# Patient Record
Sex: Male | Born: 1945 | Race: Black or African American | Hispanic: No | Marital: Married | Smoking: Former smoker
Health system: Southern US, Community
[De-identification: ages and names within clinical notes are randomized; demographics above are authoritative.]

## PROBLEM LIST (undated history)

## (undated) DIAGNOSIS — I639 Cerebral infarction, unspecified: Secondary | ICD-10-CM

## (undated) DIAGNOSIS — R4701 Aphasia: Secondary | ICD-10-CM

## (undated) DIAGNOSIS — R569 Unspecified convulsions: Secondary | ICD-10-CM

## (undated) DIAGNOSIS — C3490 Malignant neoplasm of unspecified part of unspecified bronchus or lung: Secondary | ICD-10-CM

## (undated) HISTORY — DX: Aphasia: R47.01

## (undated) HISTORY — DX: Cerebral infarction, unspecified: I63.9

## (undated) HISTORY — PX: PARTIAL COLECTOMY: SHX5273

## (undated) HISTORY — DX: Malignant neoplasm of unspecified part of unspecified bronchus or lung: C34.90

## (undated) HISTORY — DX: Unspecified convulsions: R56.9

---

## 2007-11-25 ENCOUNTER — Emergency Department: Payer: Self-pay | Admitting: Unknown Physician Specialty

## 2007-11-27 ENCOUNTER — Other Ambulatory Visit: Payer: Self-pay

## 2007-11-27 ENCOUNTER — Inpatient Hospital Stay: Payer: Self-pay | Admitting: Internal Medicine

## 2009-02-02 ENCOUNTER — Emergency Department: Payer: Self-pay | Admitting: Emergency Medicine

## 2014-05-04 DIAGNOSIS — Z1211 Encounter for screening for malignant neoplasm of colon: Secondary | ICD-10-CM | POA: Insufficient documentation

## 2014-05-29 ENCOUNTER — Ambulatory Visit: Payer: Self-pay | Admitting: Gastroenterology

## 2014-06-01 LAB — PATHOLOGY REPORT

## 2014-07-26 ENCOUNTER — Ambulatory Visit: Payer: Self-pay | Admitting: Surgery

## 2014-08-08 ENCOUNTER — Ambulatory Visit: Payer: Self-pay | Admitting: Surgery

## 2014-09-26 ENCOUNTER — Ambulatory Visit: Payer: Self-pay | Admitting: Internal Medicine

## 2014-09-26 DIAGNOSIS — E785 Hyperlipidemia, unspecified: Secondary | ICD-10-CM

## 2014-09-26 DIAGNOSIS — I10 Essential (primary) hypertension: Secondary | ICD-10-CM

## 2014-09-26 LAB — CBC WITH DIFFERENTIAL/PLATELET
BASOS PCT: 1.1 %
Basophil #: 0.1 10*3/uL (ref 0.0–0.1)
EOS PCT: 2.9 %
Eosinophil #: 0.2 10*3/uL (ref 0.0–0.7)
HCT: 43.1 % (ref 40.0–52.0)
HGB: 13.9 g/dL (ref 13.0–18.0)
LYMPHS PCT: 39.1 %
Lymphocyte #: 2.8 10*3/uL (ref 1.0–3.6)
MCH: 31.2 pg (ref 26.0–34.0)
MCHC: 32.3 g/dL (ref 32.0–36.0)
MCV: 97 fL (ref 80–100)
MONO ABS: 1.2 x10 3/mm — AB (ref 0.2–1.0)
Monocyte %: 16.4 %
NEUTROS PCT: 40.5 %
Neutrophil #: 2.9 10*3/uL (ref 1.4–6.5)
Platelet: 184 10*3/uL (ref 150–440)
RBC: 4.46 10*6/uL (ref 4.40–5.90)
RDW: 15.3 % — ABNORMAL HIGH (ref 11.5–14.5)
WBC: 7.1 10*3/uL (ref 3.8–10.6)

## 2014-09-27 ENCOUNTER — Ambulatory Visit: Payer: Self-pay | Admitting: Internal Medicine

## 2014-10-10 ENCOUNTER — Ambulatory Visit: Payer: Self-pay | Admitting: Oncology

## 2014-10-10 LAB — CBC WITH DIFFERENTIAL/PLATELET
BASOS ABS: 0 10*3/uL (ref 0.0–0.1)
Basophil %: 0.4 %
EOS ABS: 0.2 10*3/uL (ref 0.0–0.7)
Eosinophil %: 3 %
HCT: 39.9 % — AB (ref 40.0–52.0)
HGB: 13.3 g/dL (ref 13.0–18.0)
LYMPHS ABS: 3 10*3/uL (ref 1.0–3.6)
Lymphocyte %: 48 %
MCH: 31.1 pg (ref 26.0–34.0)
MCHC: 33.3 g/dL (ref 32.0–36.0)
MCV: 93 fL (ref 80–100)
MONO ABS: 1 x10 3/mm (ref 0.2–1.0)
Monocyte %: 16 %
NEUTROS ABS: 2 10*3/uL (ref 1.4–6.5)
Neutrophil %: 32.6 %
PLATELETS: 217 10*3/uL (ref 150–440)
RBC: 4.28 10*6/uL — ABNORMAL LOW (ref 4.40–5.90)
RDW: 15 % — AB (ref 11.5–14.5)
WBC: 6.2 10*3/uL (ref 3.8–10.6)

## 2014-10-10 LAB — COMPREHENSIVE METABOLIC PANEL
ALK PHOS: 142 U/L — AB (ref 46–116)
ALT: 42 U/L (ref 14–63)
AST: 34 U/L (ref 15–37)
Albumin: 3.5 g/dL (ref 3.4–5.0)
Anion Gap: 6 — ABNORMAL LOW (ref 7–16)
BUN: 11 mg/dL (ref 7–18)
Bilirubin,Total: 0.2 mg/dL (ref 0.2–1.0)
CREATININE: 0.97 mg/dL (ref 0.60–1.30)
Calcium, Total: 8.4 mg/dL — ABNORMAL LOW (ref 8.5–10.1)
Chloride: 104 mmol/L (ref 98–107)
Co2: 30 mmol/L (ref 21–32)
EGFR (African American): >60
EGFR (Non-African Amer.): >60
GLUCOSE: 100 mg/dL — AB (ref 65–99)
Osmolality: 279 (ref 275–301)
Potassium: 3.7 mmol/L (ref 3.5–5.1)
Sodium: 140 mmol/L (ref 136–145)
Total Protein: 8.4 g/dL — ABNORMAL HIGH (ref 6.4–8.2)

## 2014-10-19 ENCOUNTER — Ambulatory Visit: Payer: Self-pay | Admitting: Oncology

## 2014-11-06 ENCOUNTER — Ambulatory Visit
Admit: 2014-11-06 | Disposition: A | Discharge status: Home / Self Care | Payer: Self-pay | Attending: Oncology | Admitting: Oncology

## 2014-11-15 ENCOUNTER — Ambulatory Visit: Payer: Self-pay | Admitting: Internal Medicine

## 2014-11-30 ENCOUNTER — Ambulatory Visit: Payer: Self-pay | Admitting: Vascular Surgery

## 2014-12-06 LAB — CBC CANCER CENTER
Basophil #: 0 x10 3/mm (ref 0.0–0.1)
Basophil %: 0.5 %
EOS PCT: 2.2 %
Eosinophil #: 0.1 x10 3/mm (ref 0.0–0.7)
HCT: 39.8 % — ABNORMAL LOW (ref 40.0–52.0)
HGB: 13.4 g/dL (ref 13.0–18.0)
Lymphocyte #: 2.7 x10 3/mm (ref 1.0–3.6)
Lymphocyte %: 43.4 %
MCH: 31.5 pg (ref 26.0–34.0)
MCHC: 33.6 g/dL (ref 32.0–36.0)
MCV: 94 fL (ref 80–100)
MONO ABS: 1 x10 3/mm (ref 0.2–1.0)
Monocyte %: 16.1 %
Neutrophil #: 2.4 x10 3/mm (ref 1.4–6.5)
Neutrophil %: 37.8 %
Platelet: 183 x10 3/mm (ref 150–440)
RBC: 4.25 10*6/uL — ABNORMAL LOW (ref 4.40–5.90)
RDW: 14.7 % — ABNORMAL HIGH (ref 11.5–14.5)
WBC: 6.2 x10 3/mm (ref 3.8–10.6)

## 2014-12-06 LAB — COMPREHENSIVE METABOLIC PANEL
ANION GAP: 4 — AB (ref 7–16)
AST: 37 U/L
Albumin: 3.9 g/dL
Alkaline Phosphatase: 133 U/L — ABNORMAL HIGH
BUN: 14 mg/dL
Bilirubin,Total: 0.3 mg/dL
CALCIUM: 8.8 mg/dL — AB
CHLORIDE: 104 mmol/L
Co2: 28 mmol/L
Creatinine: 0.83 mg/dL
EGFR (African American): >60
GLUCOSE: 132 mg/dL — AB
Potassium: 3.4 mmol/L — ABNORMAL LOW
SGPT (ALT): 33 U/L
Sodium: 136 mmol/L
TOTAL PROTEIN: 8.5 g/dL — AB

## 2014-12-06 LAB — CREATININE, SERUM: Creatine, Serum: 0.83

## 2014-12-07 ENCOUNTER — Ambulatory Visit
Admit: 2014-12-07 | Disposition: A | Discharge status: Home / Self Care | Payer: Self-pay | Attending: Oncology | Admitting: Oncology

## 2014-12-12 DIAGNOSIS — C3490 Malignant neoplasm of unspecified part of unspecified bronchus or lung: Secondary | ICD-10-CM | POA: Insufficient documentation

## 2014-12-12 DIAGNOSIS — C7989 Secondary malignant neoplasm of other specified sites: Secondary | ICD-10-CM

## 2014-12-13 LAB — CBC CANCER CENTER
BASOS ABS: 0.1 x10 3/mm (ref 0.0–0.1)
BASOS PCT: 0.8 %
Eosinophil #: 0 x10 3/mm (ref 0.0–0.7)
Eosinophil %: 0.6 %
HCT: 39 % — ABNORMAL LOW (ref 40.0–52.0)
HGB: 13 g/dL (ref 13.0–18.0)
LYMPHS ABS: 1.4 x10 3/mm (ref 1.0–3.6)
LYMPHS PCT: 17.9 %
MCH: 30.8 pg (ref 26.0–34.0)
MCHC: 33.3 g/dL (ref 32.0–36.0)
MCV: 93 fL (ref 80–100)
MONO ABS: 0.9 x10 3/mm (ref 0.2–1.0)
Monocyte %: 11.2 %
NEUTROS ABS: 5.4 x10 3/mm (ref 1.4–6.5)
NEUTROS PCT: 69.5 %
Platelet: 114 x10 3/mm — ABNORMAL LOW (ref 150–440)
RBC: 4.21 10*6/uL — ABNORMAL LOW (ref 4.40–5.90)
RDW: 14.4 % (ref 11.5–14.5)
WBC: 7.8 x10 3/mm (ref 3.8–10.6)

## 2014-12-13 LAB — COMPREHENSIVE METABOLIC PANEL
ALBUMIN: 3.8 g/dL
ALK PHOS: 139 U/L — AB
ALT: 30 U/L
AST: 34 U/L
Anion Gap: 6 — ABNORMAL LOW (ref 7–16)
BUN: 15 mg/dL
Bilirubin,Total: 0.3 mg/dL
CHLORIDE: 99 mmol/L — AB
CREATININE: 0.92 mg/dL
Calcium, Total: 8.5 mg/dL — ABNORMAL LOW
Co2: 29 mmol/L
EGFR (Non-African Amer.): >60
GLUCOSE: 125 mg/dL — AB
Potassium: 2.6 mmol/L — ABNORMAL LOW
Sodium: 134 mmol/L — ABNORMAL LOW
TOTAL PROTEIN: 8.5 g/dL — AB

## 2014-12-27 LAB — CBC CANCER CENTER
BASOS ABS: 0 x10 3/mm (ref 0.0–0.1)
Basophil %: 0.5 %
Eosinophil #: 0 x10 3/mm (ref 0.0–0.7)
Eosinophil %: 0.4 %
HCT: 38.1 % — ABNORMAL LOW (ref 40.0–52.0)
HGB: 12.6 g/dL — AB (ref 13.0–18.0)
LYMPHS ABS: 3 x10 3/mm (ref 1.0–3.6)
Lymphocyte %: 29.5 %
MCH: 31.2 pg (ref 26.0–34.0)
MCHC: 33.1 g/dL (ref 32.0–36.0)
MCV: 94 fL (ref 80–100)
MONO ABS: 1.2 x10 3/mm — AB (ref 0.2–1.0)
Monocyte %: 12.1 %
NEUTROS PCT: 57.5 %
Neutrophil #: 5.9 x10 3/mm (ref 1.4–6.5)
Platelet: 273 x10 3/mm (ref 150–440)
RBC: 4.05 10*6/uL — AB (ref 4.40–5.90)
RDW: 14.8 % — ABNORMAL HIGH (ref 11.5–14.5)
WBC: 10.3 x10 3/mm (ref 3.8–10.6)

## 2014-12-27 LAB — COMPREHENSIVE METABOLIC PANEL
ALT: 20 U/L
Albumin: 3.8 g/dL
Alkaline Phosphatase: 129 U/L — ABNORMAL HIGH
Anion Gap: 4 — ABNORMAL LOW (ref 7–16)
BILIRUBIN TOTAL: 0.4 mg/dL
BUN: 11 mg/dL
Calcium, Total: 9.1 mg/dL
Chloride: 106 mmol/L
Co2: 25 mmol/L
Creatinine: 0.89 mg/dL
EGFR (African American): >60
EGFR (Non-African Amer.): >60
Glucose: 118 mg/dL — ABNORMAL HIGH
POTASSIUM: 4.9 mmol/L
SGOT(AST): 27 U/L
Sodium: 135 mmol/L
TOTAL PROTEIN: 8.9 g/dL — AB

## 2014-12-31 LAB — CYTOLOGY - NON PAP

## 2015-01-06 NOTE — Consult Note (Signed)
Reason for Visit: This 69 year old Male patient presents to the clinic for initial evaluation of  lung cancer .   Referred by Dr. Grayland Ormond.  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 He presented to his primary care doctor with increasing cough and shortness of breath no hemoptysis. Chest x-ray showed a left hilar mass CT scan demonstrated lingular mass of the left lung with enlarged left infrahilar lymph nodes. MRI of the brain showed no development definitive intracranial mass remote cerebral infarction and chronic hemorrhage left side of the brain noted. PET/CT scan showed large hypermetabolic left infrahilar and lingular mass consistent with known primary lung cancer also subcarinal lymph nodes were hypermetabolic. There is also a focal area of hypermetabolic activity in the lower pancreatic head suspicious for pancreatic head neoplasm.bronchoscopy was performed and biopsy was positive for squamous cell carcinoma. He has been seen by medical oncology is now referred relation oncology for opinion. Patient otherwise is doing well cough seems to have improved he is having no hemoptysis or marked shortness of breath at this time.  Past Hx:    Seizures:    UTI:    expressive asphasia:    CVA/Stroke:    Denies medical history:    Colectomy:   Past, Family and Social History:   Past Medical History positive   Genitourinary UTIs   Neurological/Psychiatric CVA; expressive aphasia, seizures   Past Surgical History colectomy   Family History noncontributory   Social History positive   Social History Comments 30 pack year smoking history quit smoking 2009 no EtOH abuse history   Additional Past Medical and Surgical History accompanied by his brother today who communicates for him.   Allergies:   No Known Allergies:   Home Meds:  Home Medications: Medication Instructions Status  baclofen 10 mg oral tablet 1 tab(s) orally 3 times a day Active  Plavix 75 mg oral tablet 1 tab(s) orally once a day Active  folic acid 1 mg oral tablet 1 tab(s) orally once a day Active  Enulose 10 g/15 mL oral and rectal liquid 15 milliliter(s) oral and rectal once a day Active  levETIRAcetam 500 mg oral tablet 1 tab(s) orally 2 times a day Active  Senna Plus 50 mg-8.6 mg oral tablet 2 tab(s) orally 2 times a day Active  atorvastatin 40 mg oral tablet 1 tab(s) orally once a day 4pm Active  phenytoin 100 mg oral capsule, extended release 3 cap(s) orally once a day 4pm Active  Vitamin D2 50,000 intl units oral capsule 1 cap(s) orally once a month Active  Eucerin - topical cream Apply topically to affected area , As Needed Active   Review of Systems:  General negative   Performance Status (ECOG) 0   Skin negative   Breast negative   Ophthalmologic negative   ENMT negative   Respiratory and Thorax see HPI   Cardiovascular negative   Gastrointestinal negative   Genitourinary  negative   Musculoskeletal negative   Neurological see HPI   Psychiatric negative   Hematology/Lymphatics negative   Endocrine negative   Allergic/Immunologic negative   Nursing Notes:  Nursing Vital Signs and Chemo Nursing Nursing Notes: *CC Vital Signs Flowsheet:   17-Feb-16 14:19  Temp Temperature 97.6  Pulse Pulse 96  Respirations Respirations 18  SBP SBP 120  DBP DBP 75   Pain Scale (0-10)  0  Height (cm) centimeters 177.8   Physical Exam:  General/Skin/HEENT:  Skin normal   Eyes normal   ENMT normal   Head and Neck normal   Additional PE well-developed wheelchair-bound male with expressive aphasia and obvious right-sided weakness from prior CVA. No cervical or supra-clavicular adenopathy is identified know a bit no apparent scleral icterus is noted. Lungs are clear to A&P cardiac examination shows regular rate and rhythm. Abdomen is benign.   Breasts/Resp/CV/GI/GU:  Respiratory and Thorax normal   Cardiovascular normal   Gastrointestinal normal   Genitourinary normal   MS/Neuro/Psych/Lymph:  Musculoskeletal normal   Neurological normal   Lymphatics normal   Other Results:  Radiology Results: MRI:    08-Feb-16 14:06, MRI Brain Without Contrast  MRI Brain Without Contrast   REASON FOR EXAM:    lung cancer initial staging  COMMENTS:       PROCEDURE: MR  - MR BRAIN WO CONTRAST  - Oct 15 2014  2:06PM     CLINICAL DATA:  Recent diagnosis of lung cancer, staging. Remote  history of cerebral infarction.    EXAM:  MRI HEAD WITHOUT CONTRAST    TECHNIQUE:  Multiplanar, multiecho pulse sequences of the brain and surrounding  structures were obtained without intravenous contrast.  COMPARISON:  CT head 02/02/2009.    FINDINGS:  Attempts to obtain IV access were unsuccessful. Study done without  contrast.    No restricted diffusion, visible acute hemorrhage, mass lesion, or  extra-axial fluid. There is generalized atrophy with severe brain  substance loss on the LEFT related to an old cerebral infarction.  There is evidence for chronic hemorrhage in the LEFT basal ganglia,  LEFT periventricular white matter, and RIGHT subcortical white  matter. Compensatory enlargement LEFT lateral ventricle. Wallerian  degeneration LEFT brainstem. Flow voids in the carotid, basilar, and  both vertebrals are maintained. Normal pituitary and  cerebellar  tonsils. Severe cervical spondylosis with cord compression due to  disc osteophyte complex at C3-C4, incompletely evaluated. Negative  orbits. No sinus or mastoid disease. No destructive osseous lesion.     IMPRESSION:  No definite intracranial mass lesion is identified. No destructive  osseous lesion is noted.    Severe cervical spondylosis.    Remote cerebral infarction and chronic hemorrhage with significant  LEFT-sided brain substance loss. No definite acute intracranial  findings.      Electronically Signed    By: Rolla Flatten M.D.    On: 10/15/2014 14:59         Verified By: Staci Righter, M.D.,  CT:    02-Dec-15 14:33, CT Chest With Contrast  CT Chest With Contrast   REASON FOR EXAM:    Mass on Lower Lobe of LT Lung Office will fax labs  COMMENTS:       PROCEDURE: CT  - CT CHEST WITH CONTRAST  - Aug 08 2014  2:33PM     CLINICAL DATA:  Evaluate left lower lobe lung mass seen on recent  abdominal CT scan.    EXAM:  CT CHEST WITH CONTRAST  TECHNIQUE:  Multidetector CT imaging of the chest was performed during  intravenous contrast administration.  CONTRAST:  75 cc Isovue-300    COMPARISON:  Abdominal CT scan 07/26/2014    FINDINGS:  Chest wall: No chest wall mass, supraclavicular or axillary  lymphadenopathy. A large cyst is noted in the right thyroid lobe.  The bony thorax is intact.    Mediastinum: The heart is normal in size. No pericardial effusion.  The aorta is normal in caliber. No dissection. The branch vessels  are patent. No mediastinal mass or adenopathy. There is an enlarged  left infrahilar node on image number 31 which measures a maximum of  24 mm. Small scattered lymph nodes are noted. The esophagus is  grossly normal.  Lungs: There is amasslike area of airspace consolidation in the  lingula adjacent to the major fissure. This could be infection/  pneumonia but I do not see any definite air bronchograms or vessels  coursing  through this area. A neoplastic process is likely.  Bronchoscopy or PET-CT may be helpful for further evaluation.    There is bilateral lower lobe airspace disease which could be  dependent atelectasis/ edema or possible infiltrates.    Upper abdomen:  No significant findings.     IMPRESSION:  1. Lingular mass could reflect neoplasm or possible infection (less  likely). Recommend bronchoscopy or PET-CT for further evaluation.  2. Enlarged left infrahilar lymph node.  3. Bibasilar dependent atelectasis/edema or infiltrates.      Electronically Signed    By: Kalman Jewels M.D.    On: 08/08/2014 15:22         Verified By: Marlane Hatcher, M.D.,  Nuclear Med:    12-Feb-16 11:25, PET/CT Scan Lung Cancer Initial Staging  PET/CT Scan Lung Cancer Initial Staging   REASON FOR EXAM:    lung cancer initial stage  COMMENTS:       PROCEDURE: PET - PET/CT INIT STAGING LUNG CA  - Oct 19 2014 11:25AM     CLINICAL DATA:  Initial treatment strategy for Lung cancer.    EXAM:  NUCLEAR MEDICINE PET SKULL BASE TO THIGH    TECHNIQUE:  12.69 mCi F-18 FDG was injected intravenously. Full-ring PET imaging  was performed from the skull base to thigh after the radiotracer. CT  data was obtained and used for attenuation correction and anatomic  localization.  FASTING BLOOD GLUCOSE:  Value: 87 mg/dl    COMPARISON:  Chest CT 08/08/2014    FINDINGS:  NECK    No hypermetabolic lymph nodes in the neck. There is a  benign-appearing cystic lesion in the right thyroid lobe.    CHEST    The left hilar mass is hypermetabolic with SUV max of 96.2. This  extends out into the lingula. There is also a hypermetabolic  subcarinal node with SUV max of 4.3. No contralateral mediastinal  adenopathy.    ABDOMEN/PELVIS    Focal area of FDG uptake is noted in the region the pancreatic head.  SUV max is 7.2. I do not see an obvious mass on the CT scan but  findings are certainly worrisome for  pancreatic neoplasm. MRI  abdomen without and with contrast may be helpful for further  evaluation. No hypermetabolic liver lesions to suggest hepatic  metastatic disease. No adrenal gland lesions. No hypermetabolic  lymphadenopathy.    SKELETON    No focal hypermetabolic activity to suggest skeletal metastasis.   IMPRESSION:  1. Large hypermetabolic affect left infrahilar and lingular mass  consistent with primary lung neoplasm.  2. Hypermetabolic subcarinal lymph node. No contralateral  mediastinal adenopathy.  3. Focal area of hypermetabolism in the lower pancreatic head  suspicious for pancreatic head neoplasm. Metastatic adenopathy  adjacent to the pancreatic head would also be possible. MRI abdomen  without with contrast may be helpful for further evaluation.      Electronically Signed    By: Marijo Sanes M.D.    On: 10/19/2014 14:10     Verified By: Marlane Hatcher, M.D.,   Relevent Results:   Relevant Scans and Labs CT scans MRI scans and PET CT scan reviewed   Assessment and Plan: Impression:   stage IIIa square cell carcinoma of the left lung hilum and lingula in 69 year old male with expressive aphasia. Also as yet on worked up pancreatectomy mass concerning for malignancy Plan:   I discussed the case personally with Dr. Grayland Ormond. He will be arranging GI workup and possible ultrasound of his pancreatic head lesion to rule out malignancy. I believe we would still treat his lung cancer is a IIIa lesion with concurrent chemoradiation. Possible chemoradiation or chemotherapy alone for his pancreatic mass if this does prove to be pancreas adenocarcinoma will be discussed. I've explained all the risks benefits of radiation for lung cancer to the patient and his brother. Also explained the workup for pancreatic cancer. Will follow up with medical oncology after GI consultation.  I would like to take this opportunity for allowing me to participate in the care of your  patient..  Fax to Physician:  Physicians To Recieve Fax: Marden Noble, MD - [phone].  Electronic Signatures: Lash Matulich, Roda Shutters (MD)  (Signed 17-Feb-16 15:05)  Authored: HPI, Diagnosis, Past Hx, PFSH, Allergies, Home Meds, ROS, Nursing Notes, Physical Exam, Other Results, Relevent Results, Encounter Assessment and Plan, Fax to Physician   Last Updated: 17-Feb-16 15:05 by Armstead Peaks (MD)

## 2015-01-06 NOTE — Op Note (Signed)
PATIENT NAME:  GEORGES, VICTORIO MR#:  [id number] DATE OF BIRTH:  16-Mar-1946  DATE OF PROCEDURE:  11/30/2014  PREOPERATIVE DIAGNOSIS: Lung carcinoma, metastatic.   POSTOPERATIVE DIAGNOSIS: Lung carcinoma, metastatic.   PROCEDURE PERFORMED: Insertion of a right internal jugular Port-A-Cath with ultrasound and fluoroscopic guidance.   SURGEON: Hortencia Pilar, M.D.   SEDATION: Versed plus fentanyl.   CONTRAST USED: None.   FLUOROSCOPY TIME: Approximately 0.5 minutes.   INDICATIONS:  The patient is a 69 year old gentleman with lung carcinoma, now metastatic to the pancreas who is going to have chemotherapy and, therefore, requires adequate IV access. Risks and benefits have been reviewed. The patient and guardian have agreed to proceed.   DESCRIPTION OF PROCEDURE: The patient is taken to special procedures and placed supine, and after adequate sedation is achieved the right neck and chest wall are prepped and draped in a sterile fashion. Ultrasound is placed in a sterile sleeve. Jugular vein is identified. It is echolucent and compressible indicating patency. Image is recorded for the permanent record. Under real-time visualization, the jugular vein is accessed with a Seldinger needle.. J wire is then advanced under fluoroscopic guidance into the inferior vena cava. Counterincision is made at the wire insertion site. Linear incision is made 2 fingerbreadths below the clavicle and a pocket for the port is fashioned using blunt and sharp dissection. Hub of the port is used to verify the pocket is appropriate size. Subsequently, the catheter is pulled subcutaneously from the pocket to the neck counterincision. Dilator and peel-away sheath are advanced over the wire and the catheter is then inserted through the peel-away sheath.   Under fluoroscopic guidance, the tip is adjusted so that it is at the atriocaval junction. The catheter is transected. The hub connected and is slipped into the pocket without  difficulty. A Huber needle is used to access the port percutaneously. It aspirates easily and flushes well. Under fluoroscopy, it has a smooth contour with its tip in the proper position. The pocket incision is then closed in 2 layers using interrupted 3-0 Vicryl, followed by 4 Monocryl subcuticular. Neck counterincision is closed with 4-0 Monocryl subcuticular. The patient tolerated the procedure well. There were no immediate complications. Sponge and needle counts are correct, and he is taken to the recovery area in excellent condition.  ____________________________ Katha Cabal, MD ggs:am D: 11/30/2014 16:53:32 ET T: 12/01/2014 01:35:58 ET JOB#: [id number]  cc: Katha Cabal, MD, <Dictator> Kathlene November. Grayland Ormond, MD Mikeal Hawthorne Brynda Greathouse, MD  Katha Cabal MD ELECTRONICALLY SIGNED 12/18/2014 15:09

## 2015-01-07 ENCOUNTER — Ambulatory Visit: Payer: Medicare Other | Admitting: Oncology

## 2015-01-07 ENCOUNTER — Other Ambulatory Visit: Payer: Medicare Other

## 2015-01-07 ENCOUNTER — Ambulatory Visit: Payer: Medicare Other

## 2015-01-14 ENCOUNTER — Other Ambulatory Visit: Payer: Self-pay | Admitting: Oncology

## 2015-01-14 DIAGNOSIS — C3432 Malignant neoplasm of lower lobe, left bronchus or lung: Secondary | ICD-10-CM

## 2015-01-14 MED ORDER — LIDOCAINE-PRILOCAINE 2.5-2.5 % EX CREA: TOPICAL_CREAM | CUTANEOUS | Status: DC

## 2015-01-17 ENCOUNTER — Inpatient Hospital Stay: Payer: Medicare Other

## 2015-01-17 ENCOUNTER — Inpatient Hospital Stay (HOSPITAL_BASED_OUTPATIENT_CLINIC_OR_DEPARTMENT_OTHER): Payer: Medicare Other | Admitting: Oncology

## 2015-01-17 ENCOUNTER — Inpatient Hospital Stay: Payer: Medicare Other | Attending: Oncology

## 2015-01-17 DIAGNOSIS — C3432 Malignant neoplasm of lower lobe, left bronchus or lung: Secondary | ICD-10-CM

## 2015-01-17 DIAGNOSIS — Z418 Encounter for other procedures for purposes other than remedying health state: Secondary | ICD-10-CM | POA: Diagnosis not present

## 2015-01-17 DIAGNOSIS — C7889 Secondary malignant neoplasm of other digestive organs: Secondary | ICD-10-CM

## 2015-01-17 DIAGNOSIS — Z8673 Personal history of transient ischemic attack (TIA), and cerebral infarction without residual deficits: Secondary | ICD-10-CM | POA: Diagnosis not present

## 2015-01-17 DIAGNOSIS — C3490 Malignant neoplasm of unspecified part of unspecified bronchus or lung: Secondary | ICD-10-CM | POA: Insufficient documentation

## 2015-01-17 DIAGNOSIS — Z5111 Encounter for antineoplastic chemotherapy: Secondary | ICD-10-CM | POA: Diagnosis present

## 2015-01-17 DIAGNOSIS — Z87891 Personal history of nicotine dependence: Secondary | ICD-10-CM | POA: Diagnosis not present

## 2015-01-17 DIAGNOSIS — Z79899 Other long term (current) drug therapy: Secondary | ICD-10-CM | POA: Diagnosis not present

## 2015-01-17 LAB — COMPREHENSIVE METABOLIC PANEL
ALBUMIN: 4 g/dL (ref 3.5–5.0)
ALK PHOS: 98 U/L (ref 38–126)
ALT: 17 U/L (ref 17–63)
ANION GAP: 3 — AB (ref 5–15)
AST: 22 U/L (ref 15–41)
BILIRUBIN TOTAL: 0.4 mg/dL (ref 0.3–1.2)
BUN: 16 mg/dL (ref 6–20)
CHLORIDE: 107 mmol/L (ref 101–111)
CO2: 21 mmol/L — ABNORMAL LOW (ref 22–32)
Calcium: 8.4 mg/dL — ABNORMAL LOW (ref 8.9–10.3)
Creatinine, Ser: 0.89 mg/dL (ref 0.61–1.24)
GFR calc Af Amer: >60 mL/min (ref >60)
GLUCOSE: 111 mg/dL — AB (ref 65–99)
Potassium: 3.6 mmol/L (ref 3.5–5.1)
Sodium: 131 mmol/L — ABNORMAL LOW (ref 135–145)
Total Protein: 8.1 g/dL (ref 6.5–8.1)

## 2015-01-17 LAB — CBC WITH DIFFERENTIAL/PLATELET
BASOS ABS: 0.1 10*3/uL (ref 0–0.1)
Basophils Relative: 1 %
EOS PCT: 0 %
Eosinophils Absolute: 0 10*3/uL (ref 0–0.7)
HEMATOCRIT: 31.1 % — AB (ref 40.0–52.0)
Hemoglobin: 10.6 g/dL — ABNORMAL LOW (ref 13.0–18.0)
Lymphocytes Relative: 26 %
Lymphs Abs: 2.4 10*3/uL (ref 1.0–3.6)
MCH: 31.6 pg (ref 26.0–34.0)
MCHC: 34 g/dL (ref 32.0–36.0)
MCV: 92.9 fL (ref 80.0–100.0)
MONO ABS: 1.2 10*3/uL — AB (ref 0.2–1.0)
Monocytes Relative: 13 %
Neutro Abs: 5.5 10*3/uL (ref 1.4–6.5)
Neutrophils Relative %: 60 %
Platelets: 141 10*3/uL — ABNORMAL LOW (ref 150–440)
RBC: 3.35 MIL/uL — ABNORMAL LOW (ref 4.40–5.90)
RDW: 14.6 % — ABNORMAL HIGH (ref 11.5–14.5)
WBC: 9.3 10*3/uL (ref 3.8–10.6)

## 2015-01-17 MED ORDER — SODIUM CHLORIDE 0.9 % IV SOLN
Freq: Once | INTRAVENOUS | Status: AC
  Administered 2015-01-17: 10:00:00 via INTRAVENOUS
  Filled 2015-01-17: qty 250

## 2015-01-17 MED ORDER — SODIUM CHLORIDE 0.9 % IV SOLN
175.0000 mg/m2 | Freq: Once | INTRAVENOUS | Status: AC
  Administered 2015-01-17: 336 mg via INTRAVENOUS
  Filled 2015-01-17: qty 56

## 2015-01-17 MED ORDER — SODIUM CHLORIDE 0.9 % IJ SOLN
10.0000 mL | INTRAMUSCULAR | Status: DC | PRN
  Administered 2015-01-17: 10 mL
  Filled 2015-01-17: qty 10

## 2015-01-17 MED ORDER — SODIUM CHLORIDE 0.9 % IV SOLN: Freq: Once | INTRAVENOUS | Status: DC

## 2015-01-17 MED ORDER — HEPARIN SOD (PORK) LOCK FLUSH 100 UNIT/ML IV SOLN
500.0000 [IU] | Freq: Once | INTRAVENOUS | Status: AC | PRN
  Administered 2015-01-17: 500 [IU]
  Filled 2015-01-17: qty 5

## 2015-01-17 MED ORDER — SODIUM CHLORIDE 0.9 % IV SOLN
Freq: Once | INTRAVENOUS | Status: AC
  Administered 2015-01-17: 11:00:00 via INTRAVENOUS
  Filled 2015-01-17: qty 2

## 2015-01-17 MED ORDER — DIPHENHYDRAMINE HCL 50 MG/ML IJ SOLN
25.0000 mg | Freq: Once | INTRAMUSCULAR | Status: AC
Start: 2015-01-17 — End: 2015-01-17
  Administered 2015-01-17: 25 mg via INTRAVENOUS
  Filled 2015-01-17: qty 1

## 2015-01-17 MED ORDER — SODIUM CHLORIDE 0.9 % IV SOLN
Freq: Once | INTRAVENOUS | Status: AC
  Administered 2015-01-17: 11:00:00 via INTRAVENOUS
  Filled 2015-01-17: qty 5

## 2015-01-17 MED ORDER — SODIUM CHLORIDE 0.9 % IV SOLN
492.5000 mg | Freq: Once | INTRAVENOUS | Status: AC
  Administered 2015-01-17: 490 mg via INTRAVENOUS
  Filled 2015-01-17: qty 49

## 2015-01-17 MED ORDER — PACLITAXEL CHEMO INJECTION 300 MG/50ML: 175.0000 mg/m2 | Freq: Once | INTRAVENOUS | Status: DC

## 2015-01-17 MED ORDER — FAMOTIDINE IN NACL 20-0.9 MG/50ML-% IV SOLN
20.0000 mg | Freq: Once | INTRAVENOUS | Status: AC
Start: 2015-01-17 — End: 2015-01-17
  Administered 2015-01-17: 20 mg via INTRAVENOUS
  Filled 2015-01-17: qty 50

## 2015-01-18 ENCOUNTER — Ambulatory Visit: Payer: Medicare Other

## 2015-01-19 ENCOUNTER — Ambulatory Visit: Payer: Medicare Other

## 2015-01-19 VITALS — BP 100/71 | HR 102 | Temp 95.6°F | Resp 20

## 2015-01-19 DIAGNOSIS — Z5111 Encounter for antineoplastic chemotherapy: Secondary | ICD-10-CM | POA: Diagnosis not present

## 2015-01-19 MED ORDER — PEGFILGRASTIM INJECTION 6 MG/0.6ML ~~LOC~~
PREFILLED_SYRINGE | SUBCUTANEOUS | Status: AC
  Filled 2015-01-19: qty 0.6

## 2015-01-19 MED ORDER — PEGFILGRASTIM INJECTION 6 MG/0.6ML ~~LOC~~
6.0000 mg | PREFILLED_SYRINGE | Freq: Once | SUBCUTANEOUS | Status: AC
  Administered 2015-01-19: 6 mg via SUBCUTANEOUS

## 2015-01-20 ENCOUNTER — Ambulatory Visit: Payer: Medicare Other

## 2015-01-23 ENCOUNTER — Encounter: Payer: Self-pay | Admitting: Internal Medicine

## 2015-01-23 ENCOUNTER — Emergency Department: Payer: Medicare Other

## 2015-01-23 ENCOUNTER — Inpatient Hospital Stay
Admission: EM | Admit: 2015-01-23 | Discharge: 2015-01-25 | DRG: 871 | Disposition: A | Discharge status: Skilled Nursing Facility | Payer: Medicare Other | Attending: Internal Medicine | Admitting: Internal Medicine

## 2015-01-23 ENCOUNTER — Telehealth: Payer: Self-pay | Admitting: *Deleted

## 2015-01-23 DIAGNOSIS — I69320 Aphasia following cerebral infarction: Secondary | ICD-10-CM | POA: Diagnosis not present

## 2015-01-23 DIAGNOSIS — Y95 Nosocomial condition: Secondary | ICD-10-CM | POA: Diagnosis present

## 2015-01-23 DIAGNOSIS — I6350 Cerebral infarction due to unspecified occlusion or stenosis of unspecified cerebral artery: Secondary | ICD-10-CM | POA: Insufficient documentation

## 2015-01-23 DIAGNOSIS — Z79899 Other long term (current) drug therapy: Secondary | ICD-10-CM

## 2015-01-23 DIAGNOSIS — E876 Hypokalemia: Secondary | ICD-10-CM | POA: Diagnosis present

## 2015-01-23 DIAGNOSIS — G8320 Monoplegia of upper limb affecting unspecified side: Secondary | ICD-10-CM | POA: Diagnosis present

## 2015-01-23 DIAGNOSIS — J189 Pneumonia, unspecified organism: Secondary | ICD-10-CM | POA: Diagnosis present

## 2015-01-23 DIAGNOSIS — G40909 Epilepsy, unspecified, not intractable, without status epilepticus: Secondary | ICD-10-CM | POA: Diagnosis present

## 2015-01-23 DIAGNOSIS — C3490 Malignant neoplasm of unspecified part of unspecified bronchus or lung: Secondary | ICD-10-CM | POA: Diagnosis present

## 2015-01-23 DIAGNOSIS — R4701 Aphasia: Secondary | ICD-10-CM | POA: Insufficient documentation

## 2015-01-23 DIAGNOSIS — A419 Sepsis, unspecified organism: Principal | ICD-10-CM | POA: Diagnosis present

## 2015-01-23 DIAGNOSIS — Z8546 Personal history of malignant neoplasm of prostate: Secondary | ICD-10-CM

## 2015-01-23 DIAGNOSIS — Z66 Do not resuscitate: Secondary | ICD-10-CM | POA: Diagnosis present

## 2015-01-23 DIAGNOSIS — Z87891 Personal history of nicotine dependence: Secondary | ICD-10-CM | POA: Diagnosis not present

## 2015-01-23 DIAGNOSIS — R531 Weakness: Secondary | ICD-10-CM | POA: Diagnosis present

## 2015-01-23 DIAGNOSIS — I639 Cerebral infarction, unspecified: Secondary | ICD-10-CM | POA: Insufficient documentation

## 2015-01-23 DIAGNOSIS — C7889 Secondary malignant neoplasm of other digestive organs: Secondary | ICD-10-CM | POA: Diagnosis present

## 2015-01-23 DIAGNOSIS — Z79818 Long term (current) use of other agents affecting estrogen receptors and estrogen levels: Secondary | ICD-10-CM | POA: Diagnosis not present

## 2015-01-23 DIAGNOSIS — D638 Anemia in other chronic diseases classified elsewhere: Secondary | ICD-10-CM | POA: Diagnosis present

## 2015-01-23 LAB — URINALYSIS COMPLETE WITH MICROSCOPIC (ARMC ONLY)
Bilirubin Urine: NEGATIVE
GLUCOSE, UA: NEGATIVE mg/dL
Ketones, ur: NEGATIVE mg/dL
Nitrite: POSITIVE — AB
Protein, ur: NEGATIVE mg/dL
SPECIFIC GRAVITY, URINE: 1.021 (ref 1.005–1.030)
Squamous Epithelial / LPF: NONE SEEN
pH: 5 (ref 5.0–8.0)

## 2015-01-23 LAB — COMPREHENSIVE METABOLIC PANEL
ALT: 18 U/L (ref 17–63)
AST: 23 U/L (ref 15–41)
Albumin: 3.6 g/dL (ref 3.5–5.0)
Alkaline Phosphatase: 95 U/L (ref 38–126)
Anion gap: 7 (ref 5–15)
BUN: 15 mg/dL (ref 6–20)
CALCIUM: 8.9 mg/dL (ref 8.9–10.3)
CO2: 20 mmol/L — ABNORMAL LOW (ref 22–32)
Chloride: 108 mmol/L (ref 101–111)
Creatinine, Ser: 0.88 mg/dL (ref 0.61–1.24)
GFR calc non Af Amer: >60 mL/min (ref >60)
GLUCOSE: 122 mg/dL — AB (ref 65–99)
Potassium: 4.3 mmol/L (ref 3.5–5.1)
Sodium: 135 mmol/L (ref 135–145)
Total Bilirubin: 0.7 mg/dL (ref 0.3–1.2)
Total Protein: 7.6 g/dL (ref 6.5–8.1)

## 2015-01-23 LAB — LACTIC ACID, PLASMA: Lactic Acid, Venous: 1.9 mmol/L (ref 0.5–2.0)

## 2015-01-23 MED ORDER — PIPERACILLIN-TAZOBACTAM 3.375 G IVPB 30 MIN
3.3750 g | Freq: Once | INTRAVENOUS | Status: AC
  Administered 2015-01-23: 3.375 g via INTRAVENOUS

## 2015-01-23 MED ORDER — SODIUM CHLORIDE 0.9 % IJ SOLN: 3.0000 mL | Freq: Two times a day (BID) | INTRAMUSCULAR | Status: DC

## 2015-01-23 MED ORDER — ONDANSETRON HCL 4 MG/2ML IJ SOLN: 4.0000 mg | Freq: Four times a day (QID) | INTRAMUSCULAR | Status: DC | PRN

## 2015-01-23 MED ORDER — SODIUM CHLORIDE 0.9 % IV BOLUS (SEPSIS)
1000.0000 mL | INTRAVENOUS | Status: AC
  Administered 2015-01-23 (×2): 1000 mL via INTRAVENOUS

## 2015-01-23 MED ORDER — SODIUM CHLORIDE 0.9 % IV SOLN
INTRAVENOUS | Status: DC
  Administered 2015-01-24 (×2): via INTRAVENOUS

## 2015-01-23 MED ORDER — VANCOMYCIN HCL IN DEXTROSE 1-5 GM/200ML-% IV SOLN
INTRAVENOUS | Status: AC
  Administered 2015-01-23: 1000 mg via INTRAVENOUS
  Filled 2015-01-23: qty 200

## 2015-01-23 MED ORDER — MORPHINE SULFATE 2 MG/ML IJ SOLN: 2.0000 mg | INTRAMUSCULAR | Status: DC | PRN

## 2015-01-23 MED ORDER — SODIUM CHLORIDE 0.9 % IV BOLUS (SEPSIS)
500.0000 mL | INTRAVENOUS | Status: AC
  Administered 2015-01-24: 500 mL via INTRAVENOUS

## 2015-01-23 MED ORDER — PIPERACILLIN-TAZOBACTAM 3.375 G IVPB
INTRAVENOUS | Status: AC
  Administered 2015-01-23: 3.375 g via INTRAVENOUS
  Filled 2015-01-23: qty 50

## 2015-01-23 MED ORDER — ACETAMINOPHEN 325 MG PO TABS: 650.0000 mg | ORAL_TABLET | Freq: Four times a day (QID) | ORAL | Status: DC | PRN

## 2015-01-23 MED ORDER — ACETAMINOPHEN 650 MG RE SUPP: 650.0000 mg | Freq: Four times a day (QID) | RECTAL | Status: DC | PRN

## 2015-01-23 MED ORDER — VANCOMYCIN HCL IN DEXTROSE 1-5 GM/200ML-% IV SOLN
1000.0000 mg | Freq: Once | INTRAVENOUS | Status: AC
  Administered 2015-01-23: 1000 mg via INTRAVENOUS

## 2015-01-23 MED ORDER — HEPARIN SODIUM (PORCINE) 5000 UNIT/ML IJ SOLN
5000.0000 [IU] | Freq: Three times a day (TID) | INTRAMUSCULAR | Status: DC
  Administered 2015-01-24 – 2015-01-25 (×3): 5000 [IU] via SUBCUTANEOUS
  Filled 2015-01-23 (×3): qty 1

## 2015-01-23 MED ORDER — ONDANSETRON HCL 4 MG PO TABS: 4.0000 mg | ORAL_TABLET | Freq: Four times a day (QID) | ORAL | Status: DC | PRN

## 2015-01-23 NOTE — ED Notes (Signed)
Pt to ed via ems with c/o fever. Wants pt to be evaluated for fever. Pt with with hx of pancreatic cancer and prostate cancer. Pt is resident of [facility] and will need to be transferred back via EMS>

## 2015-01-23 NOTE — H&P (Signed)
[facility] [place] NAME: Brian Khan    MR#:  [id number]  DATE OF BIRTH:  08-27-1946   DATE OF ADMISSION:  01/23/2015  PRIMARY CARE PHYSICIAN: Marden Noble, MD   REQUESTING/REFERRING PHYSICIAN: Clearnce Hasten  CHIEF COMPLAINT:   Chief Complaint  Patient presents with  . Fever    HISTORY OF PRESENT ILLNESS:  Brian Khan  is a 69 y.o. 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 History is limited from patient and given expressive aphasia, history aided by family member at bedside. States 1 day duration fever, chills. They have noticed a cough lately, nonproductive, without shortness of breath or chest pain however no further localizing symptoms. At his nursing facility MAXIMUM TEMPERATURE 101.5. Emergency department course: Code sepsis initiated, broad-spectrum antibiotics  PAST MEDICAL HISTORY:   Past Medical History  Diagnosis Date  . Lung cancer   . Stroke   . Seizures   . Expressive aphasia     PAST SURGICAL HISTORY:   Past Surgical History  Procedure Laterality Date  . Partial colectomy      SOCIAL HISTORY:   History  Substance Use Topics  . Smoking status: Former Research scientist (life sciences)  . Smokeless tobacco: Never Used  . Alcohol Use: No    FAMILY HISTORY:   Family History  Problem Relation Age of Onset  . Diabetes Mother     DRUG ALLERGIES:  No Known Allergies  REVIEW OF SYSTEMS:  REVIEW OF SYSTEMS:  Unable to obtain given patient's mental status/medical condition   MEDICATIONS AT HOME:   Prior to Admission medications   Medication Sig Start Date End Date Taking? Authorizing Provider  Cholecalciferol (VITAMIN D3) 50000 UNITS CAPS Take 1 capsule by mouth daily.   Yes Historical Provider, MD  clopidogrel (PLAVIX) 75 MG tablet Take 75 mg by mouth daily.   Yes Historical Provider, MD  folic acid (FOLVITE) 1  MG tablet Take 1 mg by mouth daily.   Yes Historical Provider, MD  lactulose (CHRONULAC) 10 GM/15ML solution Take by mouth daily as needed for mild constipation. Oral and rectal once a day   Yes Historical Provider, MD  levETIRAcetam (KEPPRA) 500 MG tablet Take 500 mg by mouth 2 (two) times daily.   Yes Historical Provider, MD  megestrol (MEGACE) 40 MG/ML suspension Take 400 mg by mouth 2 (two) times daily.   Yes Historical Provider, MD  phenytoin (DILANTIN) 100 MG ER capsule Take 300 mg by mouth daily.    Yes Historical Provider, MD  potassium chloride SA (K-DUR,KLOR-CON) 20 MEQ tablet Take 40 mEq by mouth 2 (two) times daily.   Yes Historical Provider, MD  prochlorperazine (COMPAZINE) 10 MG tablet Take 10 mg by mouth every 6 (six) hours as needed for nausea or vomiting.   Yes Historical Provider, MD  senna-docusate (SENOKOT-S) 8.6-50 MG per tablet Take 2 tablets by mouth 2 (two) times daily.   Yes Historical Provider, MD  Skin Protectants, Misc. (EUCERIN) cream Apply 1 application topically 2 (two) times daily as needed for dry skin.    Yes Historical Provider, MD  lidocaine-prilocaine (EMLA) cream Apply to affected area once Patient not taking: Reported on 01/23/2015 01/14/15   Lloyd Huger, MD      VITAL SIGNS:  Blood pressure 113/64, pulse 116, temperature 99.1 F (37.3 C), temperature source Oral, resp. rate 26, height 6' (1.829 m), weight 165  lb (74.844 kg), SpO2 98 %.  PHYSICAL EXAMINATION:  VITAL SIGNS: Filed Vitals:   01/23/15 2230  BP: 113/64  Pulse: 116  Temp:   Resp: 26   GENERAL:68 y.o.male currently in no acute distress.  HEAD: Normocephalic, atraumatic.  EYES: Pupils equal, round, reactive to light. Extraocular muscles intact. No scleral icterus.  MOUTH: Moist mucosal membrane. Dentition intact. No abscess noted.  EAR, NOSE, THROAT: Clear without exudates. No external lesions.  NECK: Supple. No thyromegaly. No nodules. No JVD.  PULMONARY: Clear to ascultation,  without wheeze rails or rhonci. No use of accessory muscles, Good respiratory effort. good air entry bilaterally CHEST: Nontender to palpation.  CARDIOVASCULAR: S1 and S2. Tachycardic. No murmurs, rubs, or gallops. No edema. Pedal pulses 2+ bilaterally.  GASTROINTESTINAL: Soft, nontender, nondistended. No masses. Positive bowel sounds. No hepatosplenomegaly.  MUSCULOSKELETAL: No swelling, clubbing, or edema. Range of motion full in all extremities.  NEUROLOGIC: Cranial nerves II through XII are intact. No gross focal neurological deficits, other than expressive aphasia. Sensation intact. Reflexes intact.  SKIN: No ulceration, lesions, rashes, or cyanosis. Skin warm and dry. Turgor intact.  PSYCHIATRIC: Difficult to assess given expressive aphasia LABORATORY PANEL:   CBC  Recent Labs Lab 01/17/15 0842  WBC 9.3  HGB 10.6*  HCT 31.1*  PLT 141*   ------------------------------------------------------------------------------------------------------------------  Chemistries   Recent Labs Lab 01/23/15 2049  NA 135  K 4.3  CL 108  CO2 20*  GLUCOSE 122*  BUN 15  CREATININE 0.88  CALCIUM 8.9  AST 23  ALT 18  ALKPHOS 95  BILITOT 0.7   ------------------------------------------------------------------------------------------------------------------  Cardiac Enzymes No results for input(s): TROPONINI in the last 168 hours. ------------------------------------------------------------------------------------------------------------------  RADIOLOGY:  Dg Chest Port 1 View  01/23/2015   CLINICAL DATA:  Fever, abdominal pain, difficulty breathing. History of pancreatic cancer  EXAM: PORTABLE CHEST - 1 VIEW  COMPARISON:  CT chest dated 08/08/2014  FINDINGS: Mild patchy opacity in the left lower lobe, atelectasis versus pneumonia. Underlying chronic interstitial markings. No pleural effusion or pneumothorax.  The heart is normal in size.  Right chest power port terminating at the  cavoatrial junction.  IMPRESSION: Mild patchy opacity in the left lower lobe, atelectasis versus pneumonia.   Electronically Signed   By: Julian Hy M.D.   On: 01/23/2015 21:28    EKG:  No orders found for this or any previous visit.  IMPRESSION AND PLAN:   69 year old African gentleman history of non-small cell lung carcinoma stage IV presenting with one-day duration fever.  1.Sepsis, meeting septic criteria by heart rate, temperature present on arrival. Source likely pulmonary versus urinary Code sepsis initiated Panculture, urinalysis pending. Broad-spectrum antibiotics including vancomycin/Zosyn and taper antibiotics when culture data returns.  Continue IV fluid hydration to keep mean arterial pressure greater than 65.  We will repeat lactic acid given the initial is greater than 2.2.   2. Stage IV non-small cell lung carcinoma: Continue Megace consult oncology 3. Seizure disorder: Continue Keppra, Dilantin 4.Venous thromboembolism prophylactic: Heparin subcutaneous  .     All the records are reviewed and case discussed with ED provider. Management plans discussed with the patient, family and they are in agreement.  CODE STATUS: DO NOT RESUSCITATE  TOTAL TIME TAKING CARE OF THIS PATIENT: 45 minutes.    Shalla Bulluck,  Karenann Cai.D on 01/23/2015 at 10:56 PM  Between 7am to 6pm - Pager - [phone]  After 6pm: House Pager: - [phone]  [facility]  Office  [phone]  CC: Primary  care physician; Marden Noble, MD

## 2015-01-23 NOTE — ED Provider Notes (Signed)
[facility] Emergency Department Provider Note  ____________________________________________  Time seen: Approximately 7: 59 PM  I have reviewed the triage vital signs and the nursing notes.   HISTORY  Chief Complaint Fever    HPI Brian Khan is a 69 y.o. male with a history of pancreatic cancer and prostate cancer presents with fever. The patient says that he had some abdominal pain earlier in the day but no burning with urination. The patient denies any cough. No pain at this time. At his SNF they tried to obtain blood but did not have the equipment to pull off his port. There unable to obtain a peripheral IV.Last chemotherapy this past Monday.   Past Medical History  Diagnosis Date  . Lung cancer   . Stroke   . Seizures   . Expressive aphasia     Patient Active Problem List   Diagnosis Date Noted  . Lung cancer     Past Surgical History  Procedure Laterality Date  . Partial colectomy      Current Outpatient Rx  Name  Route  Sig  Dispense  Refill  . atorvastatin (LIPITOR) 40 MG tablet   Oral   Take 40 mg by mouth daily. Take at 4:00 pm         . baclofen (LIORESAL) 10 MG tablet   Oral   Take 10 mg by mouth 3 (three) times daily.         . clopidogrel (PLAVIX) 75 MG tablet   Oral   Take 75 mg by mouth daily.         . ergocalciferol (VITAMIN D2) 50000 UNITS capsule   Oral   Take 50,000 Units by mouth every 30 (thirty) days.         . folic acid (FOLVITE) 1 MG tablet   Oral   Take 1 mg by mouth daily.         Marland Kitchen lactulose (CHRONULAC) 10 GM/15ML solution   Oral   Take 10 g by mouth daily. Oral and rectal once a day         . levETIRAcetam (KEPPRA) 500 MG tablet   Oral   Take 500 mg by mouth 2 (two) times daily.         Marland Kitchen lidocaine-prilocaine (EMLA) cream      Apply to affected area once   30 g   3   . megestrol (MEGACE) 40 MG/ML suspension   Oral   Take 400 mg by mouth 2 (two) times daily.          . phenytoin (DILANTIN) 100 MG ER capsule   Oral   Take 100 mg by mouth daily. Take 3 cap(s) orally once a day at 4:00 pm         . potassium chloride SA (K-DUR,KLOR-CON) 20 MEQ tablet   Oral   Take 40 mEq by mouth 2 (two) times daily.         . prochlorperazine (COMPAZINE) 10 MG tablet   Oral   Take 10 mg by mouth every 6 (six) hours as needed for nausea or vomiting.         . senna-docusate (SENOKOT-S) 8.6-50 MG per tablet   Oral   Take 2 tablets by mouth 2 (two) times daily.         . Skin Protectants, Misc. (EUCERIN) cream   Topical   Apply 1 application topically as needed for dry skin.  Allergies Review of patient's allergies indicates no known allergies.  No family history on file.  Social History History  Substance Use Topics  . Smoking status: Not on file  . Smokeless tobacco: Former Systems developer    Quit date: 12/12/2007  . Alcohol Use: No    Review of Systems Constitutional: No fever/chills Eyes: No visual changes. ENT: No sore throat. Cardiovascular: Denies chest pain. Respiratory: Denies shortness of breath. Gastrointestinal: No abdominal pain.  No nausea, no vomiting.  No diarrhea.  No constipation. Genitourinary: Negative for dysuria. Musculoskeletal: Negative for back pain. Skin: Negative for rash. Neurological: Negative for headaches, focal weakness or numbness.  10-point ROS otherwise negative.  ____________________________________________   PHYSICAL EXAM:  VITAL SIGNS: ED Triage Vitals  Enc Vitals Group     BP 01/23/15 1918 109/67 mmHg     Pulse Rate 01/23/15 1918 132     Resp 01/23/15 1918 22     Temp 01/23/15 1918 99.1 F (37.3 C)     Temp Source 01/23/15 1918 Oral     SpO2 01/23/15 1918 100 %     Weight 01/23/15 1918 165 lb (74.844 kg)     Height 01/23/15 1918 6' (1.829 m)     Head Cir --      Peak Flow --      Pain Score 01/23/15 1926 0     Pain Loc --      Pain Edu? --      Excl. in [place]? --      Constitutional: Alert and oriented. Well appearing and in no acute distress. Eyes: Conjunctivae are normal. PERRL. EOMI. Head: Atraumatic. Nose: No congestion/rhinnorhea. Mouth/Throat: Mucous membranes are moist.  Oropharynx non-erythematous. Neck: No stridor.   Cardiovascular: Tachycardic. Grossly normal heart sounds.  Good peripheral circulation. Respiratory: Normal respiratory effort.  No retractions. Lungs CTAB. Gastrointestinal: Soft and nontender. No distention. No abdominal bruits. No CVA tenderness. Genitourinary: Uncircumcised with easily retractable foreskin but with foul odor once foreskin retracted. No buildup under the foreskin. Musculoskeletal: No lower extremity tenderness nor edema.  No joint effusions. Neurologic:  Has an expressive aphasia with this is baseline. Right upper extremity paralysis with right lower extremity 2-3 out of 5 strength which is baseline.  Skin:  Skin is warm, dry and intact. No rash noted. Psychiatric: Mood and affect are normal. Speech and behavior are normal.  ____________________________________________   LABS (all labs ordered are listed, but only abnormal results are displayed)  Labs Reviewed  COMPREHENSIVE METABOLIC PANEL - Abnormal; Notable for the following:    CO2 20 (*)    Glucose, Bld 122 (*)    All other components within normal limits  CULTURE, BLOOD (ROUTINE X 2)  CULTURE, BLOOD (ROUTINE X 2)  URINE CULTURE  CBC WITH DIFFERENTIAL/PLATELET  URINALYSIS COMPLETEWITH MICROSCOPIC ([facility])   LACTIC ACID, PLASMA  LACTIC ACID, PLASMA   ____________________________________________  EKG   ____________________________________________  RADIOLOGY  Mild patchy opacity in the left lower lobe ____________________________________________   PROCEDURES    ____________________________________________   INITIAL IMPRESSION / ASSESSMENT AND PLAN / ED COURSE  Pertinent labs & imaging results that were available during my  care of the patient were reviewed by me and considered in my medical decision making (see chart for details).  Sepsis protocol initiated.  ----------------------------------------- 10:09 PM on 01/23/2015 -----------------------------------------  Patient resting comfortably but is still tachycardic. Will be admitted for sepsis. This patient is high risk given his history of cancer. Urine is still pending. Hospitalist to follow lab results.  ____________________________________________   FINAL CLINICAL IMPRESSION(S) / ED DIAGNOSES  Acute sepsis. Possible pneumonia source. Initial visit.    Orbie Pyo, MD 01/23/15 2209

## 2015-01-23 NOTE — ED Notes (Signed)
Patient presents to ED from [facility] - delay in getting patient report from nursing home. Patient has had a fever - currently on chemo for metastatic cancer (lung to pancreas). LTCF unable to obtain blood samples - sent over for further evaluation and blood cultures.

## 2015-01-23 NOTE — Telephone Encounter (Signed)
Patient had chemo last week, he is crying and is unable to express what is bothering him. His temp is 101.5, HR 128, BP 123/76.  Instructed per VO Dr Grayland Ormond to send patient to Er for probable infection and possible admission

## 2015-01-24 DIAGNOSIS — J189 Pneumonia, unspecified organism: Secondary | ICD-10-CM

## 2015-01-24 DIAGNOSIS — C7889 Secondary malignant neoplasm of other digestive organs: Secondary | ICD-10-CM

## 2015-01-24 DIAGNOSIS — A419 Sepsis, unspecified organism: Principal | ICD-10-CM

## 2015-01-24 DIAGNOSIS — C3490 Malignant neoplasm of unspecified part of unspecified bronchus or lung: Secondary | ICD-10-CM

## 2015-01-24 LAB — CBC
HCT: 20.9 % — ABNORMAL LOW (ref 40.0–52.0)
HCT: 22.5 % — ABNORMAL LOW (ref 40.0–52.0)
HEMOGLOBIN: 7.1 g/dL — AB (ref 13.0–18.0)
HEMOGLOBIN: 7.4 g/dL — AB (ref 13.0–18.0)
MCH: 31.3 pg (ref 26.0–34.0)
MCH: 32.2 pg (ref 26.0–34.0)
MCHC: 33.1 g/dL (ref 32.0–36.0)
MCHC: 34.2 g/dL (ref 32.0–36.0)
MCV: 94 fL (ref 80.0–100.0)
MCV: 94.6 fL (ref 80.0–100.0)
PLATELETS: 114 10*3/uL — AB (ref 150–440)
Platelets: 116 10*3/uL — ABNORMAL LOW (ref 150–440)
RBC: 2.22 MIL/uL — AB (ref 4.40–5.90)
RBC: 2.38 MIL/uL — ABNORMAL LOW (ref 4.40–5.90)
RDW: 14.8 % — ABNORMAL HIGH (ref 11.5–14.5)
RDW: 14.9 % — ABNORMAL HIGH (ref 11.5–14.5)
WBC: 5.1 10*3/uL (ref 3.8–10.6)
WBC: 6 10*3/uL (ref 3.8–10.6)

## 2015-01-24 LAB — BASIC METABOLIC PANEL
Anion gap: 4 — ABNORMAL LOW (ref 5–15)
BUN: 12 mg/dL (ref 6–20)
CO2: 20 mmol/L — ABNORMAL LOW (ref 22–32)
CREATININE: 0.79 mg/dL (ref 0.61–1.24)
Calcium: 7.8 mg/dL — ABNORMAL LOW (ref 8.9–10.3)
Chloride: 111 mmol/L (ref 101–111)
Glucose, Bld: 105 mg/dL — ABNORMAL HIGH (ref 65–99)
Potassium: 3.5 mmol/L (ref 3.5–5.1)
SODIUM: 135 mmol/L (ref 135–145)

## 2015-01-24 LAB — CREATININE, SERUM
Creatinine, Ser: 0.82 mg/dL (ref 0.61–1.24)
GFR calc non Af Amer: >60 mL/min (ref >60)

## 2015-01-24 LAB — MRSA PCR SCREENING: MRSA BY PCR: NEGATIVE

## 2015-01-24 MED ORDER — PHENYTOIN SODIUM EXTENDED 100 MG PO CAPS
300.0000 mg | ORAL_CAPSULE | Freq: Every day | ORAL | Status: DC
  Administered 2015-01-24 – 2015-01-25 (×2): 300 mg via ORAL
  Filled 2015-01-24 (×2): qty 3

## 2015-01-24 MED ORDER — HYDROCERIN EX CREA
1.0000 "application " | TOPICAL_CREAM | Freq: Two times a day (BID) | CUTANEOUS | Status: DC | PRN
  Filled 2015-01-24: qty 113

## 2015-01-24 MED ORDER — VANCOMYCIN HCL 10 G IV SOLR
1250.0000 mg | Freq: Two times a day (BID) | INTRAVENOUS | Status: DC
  Administered 2015-01-24 – 2015-01-25 (×3): 1250 mg via INTRAVENOUS
  Filled 2015-01-24 (×5): qty 1250

## 2015-01-24 MED ORDER — LEVETIRACETAM 500 MG PO TABS
500.0000 mg | ORAL_TABLET | Freq: Two times a day (BID) | ORAL | Status: DC
  Administered 2015-01-24 – 2015-01-25 (×4): 500 mg via ORAL
  Filled 2015-01-24 (×4): qty 1

## 2015-01-24 MED ORDER — SENNOSIDES-DOCUSATE SODIUM 8.6-50 MG PO TABS
2.0000 | ORAL_TABLET | Freq: Two times a day (BID) | ORAL | Status: DC
  Administered 2015-01-24 – 2015-01-25 (×3): 2 via ORAL
  Filled 2015-01-24 (×4): qty 2

## 2015-01-24 MED ORDER — DARBEPOETIN ALFA 200 MCG/0.4ML IJ SOSY
200.0000 ug | PREFILLED_SYRINGE | Freq: Once | INTRAMUSCULAR | Status: DC
  Filled 2015-01-24: qty 0.4

## 2015-01-24 MED ORDER — EPOETIN ALFA 40000 UNIT/ML IJ SOLN
30000.0000 [IU] | Freq: Once | INTRAMUSCULAR | Status: AC
  Administered 2015-01-24: 22:00:00 30000 [IU] via SUBCUTANEOUS
  Filled 2015-01-24: qty 1

## 2015-01-24 MED ORDER — FOLIC ACID 1 MG PO TABS
1.0000 mg | ORAL_TABLET | Freq: Every day | ORAL | Status: DC
  Administered 2015-01-24 – 2015-01-25 (×2): 1 mg via ORAL
  Filled 2015-01-24 (×2): qty 1

## 2015-01-24 MED ORDER — FERROUS SULFATE 325 (65 FE) MG PO TABS
325.0000 mg | ORAL_TABLET | Freq: Two times a day (BID) | ORAL | Status: DC
  Administered 2015-01-24 – 2015-01-25 (×2): 325 mg via ORAL
  Filled 2015-01-24 (×2): qty 1

## 2015-01-24 MED ORDER — DOXYCYCLINE HYCLATE 100 MG PO TABS
100.0000 mg | ORAL_TABLET | Freq: Two times a day (BID) | ORAL | Status: DC
  Administered 2015-01-24 – 2015-01-25 (×2): 100 mg via ORAL
  Filled 2015-01-24 (×2): qty 1

## 2015-01-24 MED ORDER — POTASSIUM CHLORIDE CRYS ER 20 MEQ PO TBCR
40.0000 meq | EXTENDED_RELEASE_TABLET | Freq: Two times a day (BID) | ORAL | Status: DC
  Administered 2015-01-24 – 2015-01-25 (×2): 40 meq via ORAL
  Filled 2015-01-24 (×2): qty 2

## 2015-01-24 MED ORDER — PIPERACILLIN-TAZOBACTAM 3.375 G IVPB
3.3750 g | Freq: Three times a day (TID) | INTRAVENOUS | Status: DC
  Administered 2015-01-24 – 2015-01-25 (×3): 3.375 g via INTRAVENOUS
  Filled 2015-01-24 (×7): qty 50

## 2015-01-24 MED ORDER — LACTULOSE 10 GM/15ML PO SOLN: 10.0000 g | Freq: Every day | ORAL | Status: DC | PRN

## 2015-01-24 MED ORDER — VITAMIN D3 1.25 MG (50000 UT) PO CAPS: 1.0000 | ORAL_CAPSULE | Freq: Every day | ORAL | Status: DC

## 2015-01-24 MED ORDER — PIPERACILLIN-TAZOBACTAM 4.5 G IVPB
4.5000 g | Freq: Three times a day (TID) | INTRAVENOUS | Status: DC
  Administered 2015-01-24: 4.5 g via INTRAVENOUS
  Filled 2015-01-24 (×5): qty 100

## 2015-01-24 MED ORDER — CLOPIDOGREL BISULFATE 75 MG PO TABS
75.0000 mg | ORAL_TABLET | Freq: Every day | ORAL | Status: DC
  Administered 2015-01-24 – 2015-01-25 (×2): 75 mg via ORAL
  Filled 2015-01-24 (×2): qty 1

## 2015-01-24 MED ORDER — MEGESTROL ACETATE 40 MG/ML PO SUSP
400.0000 mg | Freq: Two times a day (BID) | ORAL | Status: DC
  Administered 2015-01-24 – 2015-01-25 (×2): 400 mg via ORAL
  Filled 2015-01-24 (×5): qty 10

## 2015-01-24 MED ORDER — POTASSIUM CHLORIDE CRYS ER 20 MEQ PO TBCR
40.0000 meq | EXTENDED_RELEASE_TABLET | Freq: Once | ORAL | Status: AC
  Administered 2015-01-24: 40 meq via ORAL
  Filled 2015-01-24: qty 2

## 2015-01-24 NOTE — Progress Notes (Signed)
Notified Dr. Marcille Blanco of pt's hemoglobin 7.4, no new orders at this time.

## 2015-01-24 NOTE — Progress Notes (Signed)
[facility]  Telephone:(336) [phone] Fax:(336) [phone]  ID: Brian Khan OB: 1946/04/01  MR#: [id number]  KDX#:[id number]  Patient Care Team: Marden Noble, MD as PCP - General (Internal Medicine)  CHIEF COMPLAINT:  Chief Complaint  Patient presents with  . Follow-up  . Chemotherapy    INTERVAL HISTORY: Patient is here for further evaluation and consideration of cycle 3 of carboplatinum and Taxol. The entire history is given by his brother secondary to patient's expressive aphasia. He currently feels well. He has no recent fevers. He has no chest pain. He denies any shortness of breath, cough, or hemoptysis. He has a fair appetite, but denies any nausea, vomiting, cons patient, or diarrhea. He has no urinary complaints. Patient offers no specific complaints today.   REVIEW OF SYSTEMS:   Review of Systems  Constitutional: Negative.   Respiratory: Negative.   Cardiovascular: Negative.     As per HPI. Otherwise, a complete review of systems is negatve.  PAST MEDICAL HISTORY: Past Medical History  Diagnosis Date  . Lung cancer   . Stroke   . Seizures   . Expressive aphasia     PAST SURGICAL HISTORY: Past Surgical History  Procedure Laterality Date  . Partial colectomy      FAMILY HISTORY Family History  Problem Relation Age of Onset  . Diabetes Mother        ADVANCED DIRECTIVES:    HEALTH MAINTENANCE: History  Substance Use Topics  . Smoking status: Former Research scientist (life sciences)  . Smokeless tobacco: Never Used  . Alcohol Use: No     Colonoscopy:  PAP:  Bone density:  Lipid panel:  No Known Allergies  No current facility-administered medications for this visit.   No current outpatient prescriptions on file.   Facility-Administered Medications Ordered in Other Visits  Medication Dose Route Frequency Provider Last Rate Last Dose  . acetaminophen (TYLENOL) tablet 650 mg  650 mg Oral Q6H PRN Lytle Butte, MD       Or  . acetaminophen  (TYLENOL) suppository 650 mg  650 mg Rectal Q6H PRN Lytle Butte, MD      . clopidogrel (PLAVIX) tablet 75 mg  75 mg Oral Daily Lytle Butte, MD   75 mg at 01/24/15 1001  . Darbepoetin Alfa (ARANESP) injection 200 mcg  200 mcg Subcutaneous Once Loletha Grayer, MD      . doxycycline (VIBRA-TABS) tablet 100 mg  100 mg Oral Q12H Richard Leslye Peer, MD      . ferrous sulfate tablet 325 mg  325 mg Oral BID WC Loletha Grayer, MD   325 mg at 01/24/15 1620  . folic acid (FOLVITE) tablet 1 mg  1 mg Oral Daily Lytle Butte, MD   1 mg at 01/24/15 1001  . heparin injection 5,000 Units  5,000 Units Subcutaneous 3 times per day Lytle Butte, MD   5,000 Units at 01/24/15 1000  . hydrocerin (EUCERIN) cream 1 application  1 application Topical BID PRN Lytle Butte, MD      . lactulose (CHRONULAC) 10 GM/15ML solution 10 g  10 g Oral Daily PRN Lytle Butte, MD      . levETIRAcetam (KEPPRA) tablet 500 mg  500 mg Oral BID Lytle Butte, MD   500 mg at 01/24/15 1001  . megestrol (MEGACE) 40 MG/ML suspension 400 mg  400 mg Oral BID Lytle Butte, MD   400 mg at 01/24/15 0116  . morphine 2 MG/ML injection 2 mg  2 mg Intravenous Q4H PRN Lytle Butte, MD      . ondansetron [facility]) tablet 4 mg  4 mg Oral Q6H PRN Lytle Butte, MD       Or  . ondansetron [facility]) injection 4 mg  4 mg Intravenous Q6H PRN Lytle Butte, MD      . phenytoin (DILANTIN) ER capsule 300 mg  300 mg Oral Daily Lytle Butte, MD   300 mg at 01/24/15 1000  . piperacillin-tazobactam (ZOSYN) IVPB 3.375 g  3.375 g Intravenous 3 times per day Lytle Butte, MD   3.375 g at 01/24/15 1620  . potassium chloride SA (K-DUR,KLOR-CON) CR tablet 40 mEq  40 mEq Oral BID Loletha Grayer, MD      . senna-docusate (Senokot-S) tablet 2 tablet  2 tablet Oral BID Lytle Butte, MD   2 tablet at 01/24/15 1000  . vancomycin (VANCOCIN) 1,250 mg in sodium chloride 0.9 % 250 mL IVPB  1,250 mg Intravenous Q12H Lytle Butte, MD   1,250 mg at 01/24/15 1619     OBJECTIVE: Filed Vitals:   01/17/15 0931  BP: 93/66  Pulse: 97  Temp: 96.6 F (35.9 C)  Resp: 20     Body mass index is 23.19 kg/(m^2).    ECOG FS:1 - Symptomatic but completely ambulatory  General: Well-developed, well-nourished, no acute distress. Eyes: anicteric sclera. Lungs: Clear to auscultation bilaterally. Heart: Regular rate and rhythm. No rubs, murmurs, or gallops. Abdomen: Soft, nontender, nondistended. No organomegaly noted, normoactive bowel sounds. Musculoskeletal: No edema, cyanosis, or clubbing. Neuro: Alert, answering all questions appropriately. Cranial nerves grossly intact. Skin: No rashes or petechiae noted. Psych: Normal affect.    LAB RESULTS:  Lab Results  Component Value Date   NA 135 01/24/2015   K 3.5 01/24/2015   CL 111 01/24/2015   CO2 20* 01/24/2015   GLUCOSE 105* 01/24/2015   BUN 12 01/24/2015   CREATININE 0.79 01/24/2015   CALCIUM 7.8* 01/24/2015   PROT 7.6 01/23/2015   ALBUMIN 3.6 01/23/2015   AST 23 01/23/2015   ALT 18 01/23/2015   ALKPHOS 95 01/23/2015   BILITOT 0.7 01/23/2015   GFRNONAA >60 01/24/2015   GFRAA >60 01/24/2015    Lab Results  Component Value Date   WBC 5.1 01/24/2015   NEUTROABS 5.5 01/17/2015   HGB 7.1* 01/24/2015   HCT 20.9* 01/24/2015   MCV 94.0 01/24/2015   PLT 116* 01/24/2015     STUDIES: Dg Chest Port 1 View  01/23/2015   CLINICAL DATA:  Fever, abdominal pain, difficulty breathing. History of pancreatic cancer  EXAM: PORTABLE CHEST - 1 VIEW  COMPARISON:  CT chest dated 08/08/2014  FINDINGS: Mild patchy opacity in the left lower lobe, atelectasis versus pneumonia. Underlying chronic interstitial markings. No pleural effusion or pneumothorax.  The heart is normal in size.  Right chest power port terminating at the cavoatrial junction.  IMPRESSION: Mild patchy opacity in the left lower lobe, atelectasis versus pneumonia.   Electronically Signed   By: Julian Hy M.D.   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  PLAN:    1. Lung cancer: PET results reviewed independently. MRI the brain is negative. EUS pathology confirms metastatic disease to the pancreas. Proceed with cycle 3 of 6 of carboplatinum and Taxol today. Return to clinic tomorrow for Neulasta and then  in 3 weeks for consideration of cycle 4. Patient will receive  this regimen every 3 weeks for 6 cycles with reimaging after cycle 4.  2. Pancreatic mass: PET and biopsy confirm metastatic lesion. Chemotherapy as above. 3. Hypokalemia: Potassium now within normal limits. Continue oral supplementation as prescribed. 4. Reduced appetite: Continue Megace as prescribed.    Patient expressed understanding and was in agreement with this plan. He also understands that He can call clinic at any time with any questions, concerns, or complaints.   No matching staging information was found for the patient.  Lloyd Huger, MD   01/24/2015 5:36 PM

## 2015-01-24 NOTE — Care Management (Signed)
Met with patient to discuss discharge planning. Patient was pleasant and nodded his head yes and no once. He did nod his head yes that he was from [facility]. Marcie Bal MSW updated.

## 2015-01-24 NOTE — Progress Notes (Deleted)
[facility]  Telephone:(336) [phone] Fax:(336) [phone]  ID: JAS BETTEN OB: 1945-11-04  MR#: [id number]  JGG#:[id number]  Patient Care Team: Marden Noble, MD as PCP - General (Internal Medicine)  CHIEF COMPLAINT:  Chief Complaint  Patient presents with  . Fever    INTERVAL HISTORY: Patient recently received chemotherapy for his stage IV squamous cell carcinoma of the lung with carboplatinum and Taxol one week ago. In the last 1-2 days he developed pain, shortness of breath, and fever. Given his expressive aphasia it was difficult to obtain any further review of systems. Currently, he feels significantly improved and offers no complaints.  REVIEW OF SYSTEMS:   Review of Systems  Constitutional: Positive for fever (none currently).  Respiratory: Positive for cough and shortness of breath.   Cardiovascular: Negative.   Gastrointestinal: Negative.     As per HPI. Otherwise, a complete review of systems is negatve.  PAST MEDICAL HISTORY: Past Medical History  Diagnosis Date  . Lung cancer   . Stroke   . Seizures   . Expressive aphasia     PAST SURGICAL HISTORY: Past Surgical History  Procedure Laterality Date  . Partial colectomy      FAMILY HISTORY Family History  Problem Relation Age of Onset  . Diabetes Mother        ADVANCED DIRECTIVES:    HEALTH MAINTENANCE: History  Substance Use Topics  . Smoking status: Former Research scientist (life sciences)  . Smokeless tobacco: Never Used  . Alcohol Use: No     Colonoscopy:  PAP:  Bone density:  Lipid panel:  No Known Allergies  Current Facility-Administered Medications  Medication Dose Route Frequency Provider Last Rate Last Dose  . acetaminophen (TYLENOL) tablet 650 mg  650 mg Oral Q6H PRN Lytle Butte, MD       Or  . acetaminophen (TYLENOL) suppository 650 mg  650 mg Rectal Q6H PRN Lytle Butte, MD      . clopidogrel (PLAVIX) tablet 75 mg  75 mg Oral Daily Lytle Butte, MD   75 mg at 01/24/15 1001    . Darbepoetin Alfa (ARANESP) injection 200 mcg  200 mcg Subcutaneous Once Loletha Grayer, MD      . doxycycline (VIBRA-TABS) tablet 100 mg  100 mg Oral Q12H Richard Leslye Peer, MD      . ferrous sulfate tablet 325 mg  325 mg Oral BID WC Loletha Grayer, MD   325 mg at 01/24/15 1620  . folic acid (FOLVITE) tablet 1 mg  1 mg Oral Daily Lytle Butte, MD   1 mg at 01/24/15 1001  . heparin injection 5,000 Units  5,000 Units Subcutaneous 3 times per day Lytle Butte, MD   5,000 Units at 01/24/15 1000  . hydrocerin (EUCERIN) cream 1 application  1 application Topical BID PRN Lytle Butte, MD      . lactulose (CHRONULAC) 10 GM/15ML solution 10 g  10 g Oral Daily PRN Lytle Butte, MD      . levETIRAcetam (KEPPRA) tablet 500 mg  500 mg Oral BID Lytle Butte, MD   500 mg at 01/24/15 1001  . megestrol (MEGACE) 40 MG/ML suspension 400 mg  400 mg Oral BID Lytle Butte, MD   400 mg at 01/24/15 0116  . morphine 2 MG/ML injection 2 mg  2 mg Intravenous Q4H PRN Lytle Butte, MD      . ondansetron [facility]) tablet 4 mg  4 mg Oral Q6H PRN Lytle Butte,  MD       Or  . ondansetron (ZOFRAN) injection 4 mg  4 mg Intravenous Q6H PRN Lytle Butte, MD      . phenytoin (DILANTIN) ER capsule 300 mg  300 mg Oral Daily Lytle Butte, MD   300 mg at 01/24/15 1000  . piperacillin-tazobactam (ZOSYN) IVPB 3.375 g  3.375 g Intravenous 3 times per day Lytle Butte, MD   3.375 g at 01/24/15 1620  . potassium chloride SA (K-DUR,KLOR-CON) CR tablet 40 mEq  40 mEq Oral BID Loletha Grayer, MD      . senna-docusate (Senokot-S) tablet 2 tablet  2 tablet Oral BID Lytle Butte, MD   2 tablet at 01/24/15 1000  . vancomycin (VANCOCIN) 1,250 mg in sodium chloride 0.9 % 250 mL IVPB  1,250 mg Intravenous Q12H Lytle Butte, MD   1,250 mg at 01/24/15 1619    OBJECTIVE: Filed Vitals:   01/24/15 1435  BP: 95/61  Pulse: 104  Temp: 97.8 F (36.6 C)  Resp: 20     Body mass index is 20.52 kg/(m^2).    ECOG FS:1 - Symptomatic but  completely ambulatory  General: Well-developed, well-nourished, no acute distress. Eyes: anicteric sclera. Lungs: Clear to auscultation bilaterally. Heart: Regular rate and rhythm. No rubs, murmurs, or gallops. Abdomen: Soft, nontender, nondistended. No organomegaly noted, normoactive bowel sounds. Musculoskeletal: No edema, cyanosis, or clubbing. Neuro: Alert, answering all questions appropriately. Cranial nerves grossly intact. Skin: No rashes or petechiae noted. Psych: Normal affect.    LAB RESULTS:  Lab Results  Component Value Date   NA 135 01/24/2015   K 3.5 01/24/2015   CL 111 01/24/2015   CO2 20* 01/24/2015   GLUCOSE 105* 01/24/2015   BUN 12 01/24/2015   CREATININE 0.79 01/24/2015   CALCIUM 7.8* 01/24/2015   PROT 7.6 01/23/2015   ALBUMIN 3.6 01/23/2015   AST 23 01/23/2015   ALT 18 01/23/2015   ALKPHOS 95 01/23/2015   BILITOT 0.7 01/23/2015   GFRNONAA >60 01/24/2015   GFRAA >60 01/24/2015    Lab Results  Component Value Date   WBC 5.1 01/24/2015   NEUTROABS 5.5 01/17/2015   HGB 7.1* 01/24/2015   HCT 20.9* 01/24/2015   MCV 94.0 01/24/2015   PLT 116* 01/24/2015     STUDIES: Dg Chest Port 1 View  01/23/2015   CLINICAL DATA:  Fever, abdominal pain, difficulty breathing. History of pancreatic cancer  EXAM: PORTABLE CHEST - 1 VIEW  COMPARISON:  CT chest dated 08/08/2014  FINDINGS: Mild patchy opacity in the left lower lobe, atelectasis versus pneumonia. Underlying chronic interstitial markings. No pleural effusion or pneumothorax.  The heart is normal in size.  Right chest power port terminating at the cavoatrial junction.  IMPRESSION: Mild patchy opacity in the left lower lobe, atelectasis versus pneumonia.   Electronically Signed   By: Julian Hy M.D.   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  PLAN:    1. Lung cancer: PET results reviewed  independently. MRI the brain is negative. EUS pathology confirms metastatic disease to the pancreas. Patient received cycle 3 of 6 of carboplatinum and Taxol one week ago. He has been instructed to keep his previously scheduled follow-up appointment in 2 weeks for consideration of cycle 4.  2. Pancreatic mass: PET and biopsy confirm metastatic lesion. Chemotherapy as above. 3. Hypokalemia: Potassium now within normal limits. Continue oral  supplementation as prescribed. 4. Reduced appetite: Continue Megace as prescribed. 5. Pneumonia/sepsis: Blood and urine cultures negative to date. Agree with current antibiotics.  Okay to discharge from an oncology standpoint when patient's symptoms resolve. Follow-up in 2 weeks as above. Appreciate consult, call with questions.   Lloyd Huger, MD   01/24/2015 5:41 PM

## 2015-01-24 NOTE — Plan of Care (Signed)
Problem: Discharge Progression Outcomes Goal: Discharge plan in place and appropriate Individualization of care 1. Likes to be called Brian Khan. 2. Lives at [facility] facility. 3. Has history of lung ca, pancreatic ca and prostate ca, last chemo on December 12, 2014. 4. Has history of stroke, seizure and expressive aphasia, controlled by medications. 5. On high fall precautions per policy, offer toileting during hourly rounds. 6. Has incomprehensive speech. 7. Incontinent of B&B. Goal: Other Discharge Outcomes/Goals Plan of care progress to goal for: sepsis - Continues ABX. - No fever noted this shift. - No complaints of pain. Will continue to monitor.

## 2015-01-24 NOTE — Progress Notes (Signed)
ANTIBIOTIC CONSULT NOTE - FOLLOW UP  Pharmacy Consult for Vancomycin and Zosyn Dosing  Indication: rule out sepsis  No Known Allergies  Patient Measurements: Height: 6' (182.9 cm) Weight: 151 lb 4.8 oz (68.629 kg) IBW/kg (Calculated) : 77.6 Adjusted Body Weight: 74 kg  Vital Signs: Temp: 99.7 F (37.6 C) (05/19 0532) Temp Source: Oral (05/19 0532) BP: 104/58 mmHg (05/19 0532) Pulse Rate: 113 (05/19 0532) Intake/Output from previous day:   Intake/Output from this shift: Total I/O In: 240 [P.O.:240] Out: -   Labs:  Recent Labs  01/23/15 2049 01/23/15 2344 01/24/15 0550  WBC  --  6.0 5.1  HGB  --  7.4* 7.1*  PLT  --  114* 116*  CREATININE 0.88 0.82 0.79   Estimated Creatinine Clearance: 83.4 mL/min (by C-G formula based on Cr of 0.79). No results for input(s): VANCOTROUGH, VANCOPEAK, VANCORANDOM, GENTTROUGH, GENTPEAK, GENTRANDOM, TOBRATROUGH, TOBRAPEAK, TOBRARND, AMIKACINPEAK, AMIKACINTROU, AMIKACIN in the last 72 hours.   Microbiology: Recent Results (from the past 720 hour(s))  MRSA PCR Screening     Status: None   Collection Time: 01/24/15  3:46 AM  Result Value Ref Range Status   MRSA by PCR NEGATIVE NEGATIVE Final    Comment:        The GeneXpert MRSA Assay (FDA approved for NASAL specimens only), is one component of a comprehensive MRSA colonization surveillance program. It is not intended to diagnose MRSA infection nor to guide or monitor treatment for MRSA infections.     Anti-infectives    Start     Dose/Rate Route Frequency Ordered Stop   01/24/15 1400  piperacillin-tazobactam (ZOSYN) IVPB 3.375 g     3.375 g 12.5 mL/hr over 240 Minutes Intravenous 3 times per day 01/24/15 1010     01/24/15 0600  piperacillin-tazobactam (ZOSYN) IVPB 4.5 g  Status:  Discontinued     4.5 g 25 mL/hr over 240 Minutes Intravenous 3 times per day 01/24/15 0036 01/24/15 1010   01/24/15 0300  vancomycin (VANCOCIN) 1,250 mg in sodium chloride 0.9 % 250 mL IVPB     1,250 mg 166.7 mL/hr over 90 Minutes Intravenous Every 12 hours 01/24/15 0036     01/23/15 2000  piperacillin-tazobactam (ZOSYN) IVPB 3.375 g     3.375 g 100 mL/hr over 30 Minutes Intravenous  Once 01/23/15 1959 01/23/15 2115   01/23/15 2000  vancomycin (VANCOCIN) IVPB 1000 mg/200 mL premix     1,000 mg 200 mL/hr over 60 Minutes Intravenous  Once 01/23/15 1959 01/23/15 2230      Assessment: 69 yo male being treated for sepsis with vancomycin and Zosyn. 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 Blood and Urine cultures pending review and CXR showing atelectasis vs. PNA.   Goal of Therapy:  Vancomycin trough level 15-20 mcg/ml  Plan:  Will continue patient on Zosyn EI 3.375g IV Q8hr. Will continue patient on vancomycin '1250mg'$  IV Q12hr for goal trough of 15-20. Will obtain trough prior to 1500 dose on 5/20. Will obtain follow-up serum creatinine with am labs on 5/20. Will follow cultures for possible narrowing of therapy.    Expected duration 7 days with resolution of temperature and/or normalization of WBC  Simpson,Michael L 01/24/2015,10:11 AM

## 2015-01-24 NOTE — Clinical Social Work Note (Signed)
Clinical Social Work Assessment  Patient Details  Name: Brian Khan MRN: [id number] Date of Birth: 03/23/1946  Date of referral:  01/24/15               Reason for consult:  Discharge Planning, Facility Placement                Permission sought to share information with:    Permission granted to share information::     Name::        Agency::     Relationship::     Contact Information:     Housing/Transportation Living arrangements for the past 2 months:  [place] of Information:  Adult Children Patient Interpreter Needed:  None Criminal Activity/Legal Involvement Pertinent to Current Situation/Hospitalization:  No - Comment as needed Significant Relationships:  Adult Children, Siblings, Other(Comment) Games developer) Lives with:  Facility Resident Do you feel safe going back to the place where you live?  Yes Need for family participation in patient care:  Yes (Comment)  Care giving concerns:  Pt was admitted from [facility]. No concerns identified.    Social Worker assessment / plan:  CSW spoke with pt's daughter, Brian Khan [phone]), to address consult. CSW introduce herself and explained role of social work. CSW also explained the process of discharging and returning to SNF.   Pt has been a LTC pt at [facility] since 2009. Pt's daughter would like for him to return at discharge. CSW spoke with facility and pt is able to return when stable.   CSW will complete FL2 and place on chart for MD's signature. CSW will continue to follow.   Employment status:  Retired Nurse, adult PT Recommendations:  Not assessed at this time Information / Referral to community resources:  [place]  Patient/Family's Response to care:  Pt's daughter was very pleasant and agreeable to discharge plan.   Patient/Family's Understanding of and Emotional Response to Diagnosis, Current Treatment, and  Prognosis:  Pt is LTC and needs would be best met at SNF. Pt understands that pt is to return.   Emotional Assessment Appearance:  Appears stated age Attitude/Demeanor/Rapport:  Unable to Assess Affect (typically observed):  Unable to Assess Orientation:  Fluctuating Orientation (Suspected and/or reported Sundowners) Alcohol / Substance use:  Never Used Psych involvement (Current and /or in the community):  No (Comment)  Discharge Needs  Concerns to be addressed:  Denies Needs/Concerns at this time Readmission within the last 30 days:  No Current discharge risk:  Chronically ill Barriers to Discharge:  No Barriers Identified   Darden Dates, LCSW 01/24/2015, 11:55 AM

## 2015-01-24 NOTE — Plan of Care (Signed)
Problem: Discharge Progression Outcomes Goal: Discharge plan in place and appropriate Individualization of care  Plan of care progress to goal for: Sepsis Afebrile Continue IV antibiotics Will continue to monitor.

## 2015-01-24 NOTE — Progress Notes (Signed)
[facility] PROGRESS NOTE  Brian Khan FBP:[id number] DOB: May 04, 1946 DOA: 01/23/2015 PCP: Marden Noble, MD  HPI/Subjective: Sent in with sepsis. Patient is difficult to understand secondary to aphasia. Some shortness of breath, some cough. No chest pain. Some diarrhea.  Objective: Filed Vitals:   01/24/15 1435  BP: 95/61  Pulse: 104  Temp: 97.8 F (36.6 C)  Resp: 20    Intake/Output Summary (Last 24 hours) at 01/24/15 1454 Last data filed at 01/24/15 0948  Gross per 24 hour  Intake    240 ml  Output      0 ml  Net    240 ml   Filed Weights   01/23/15 1918 01/24/15 0023  Weight: 74.844 kg (165 lb) 68.629 kg (151 lb 4.8 oz)    ROS: Review of Systems  Constitutional: Negative for fever and chills.  Eyes: Negative for blurred vision.  Respiratory: Positive for cough and shortness of breath.   Cardiovascular: Negative for chest pain.  Gastrointestinal: Positive for diarrhea. Negative for nausea, vomiting, abdominal pain and constipation.  Genitourinary: Negative for dysuria.  Musculoskeletal: Negative for joint pain.  Neurological: Negative for dizziness and headaches.   Exam: Physical Exam  HENT:  Nose: No mucosal edema.  Mouth/Throat: No oropharyngeal exudate or posterior oropharyngeal edema.  Eyes: Conjunctivae, EOM and lids are normal. Pupils are equal, round, and reactive to light.  Neck: No JVD present. Carotid bruit is not present. No edema present. No thyroid mass and no thyromegaly present.  Cardiovascular: S1 normal and S2 normal.  Exam reveals no gallop.   No murmur heard. Pulses:      Dorsalis pedis pulses are 2+ on the right side, and 2+ on the left side.  Respiratory: No respiratory distress. He has no wheezes. He has rhonchi in the right lower field and the left lower field. He has no rales.  GI: Soft. Bowel sounds are normal. There is no tenderness.  Musculoskeletal:       Right ankle: He exhibits no swelling.       Left ankle:  He exhibits no swelling.  Lymphadenopathy:    He has no cervical adenopathy.  Neurological: He is alert.  Right arm paralysis with contracture. Right leg weakness. Slurred speech and aphasia.  Skin: Skin is warm. No rash noted. Nails show no clubbing.  Psychiatric: He has a normal mood and affect.    Data Reviewed: Basic Metabolic Panel:  Recent Labs Lab 01/23/15 2049 01/23/15 2344 01/24/15 0550  NA 135  --  135  K 4.3  --  3.5  CL 108  --  111  CO2 20*  --  20*  GLUCOSE 122*  --  105*  BUN 15  --  12  CREATININE 0.88 0.82 0.79  CALCIUM 8.9  --  7.8*   Liver Function Tests:  Recent Labs Lab 01/23/15 2049  AST 23  ALT 18  ALKPHOS 95  BILITOT 0.7  PROT 7.6  ALBUMIN 3.6   CBC:  Recent Labs Lab 01/23/15 2344 01/24/15 0550  WBC 6.0 5.1  HGB 7.4* 7.1*  HCT 22.5* 20.9*  MCV 94.6 94.0  PLT 114* 116*    Recent Results (from the past 240 hour(s))  Blood Culture (routine x 2)     Status: None (Preliminary result)   Collection Time: 01/23/15  8:49 PM  Result Value Ref Range Status   Specimen Description BLOOD  Final   Special Requests NONE  Final   Culture NO GROWTH <  24 HOURS  Final   Report Status PENDING  Incomplete  Blood Culture (routine x 2)     Status: None (Preliminary result)   Collection Time: 01/23/15  8:49 PM  Result Value Ref Range Status   Specimen Description BLOOD  Final   Special Requests NONE  Final   Culture NO GROWTH < 24 HOURS  Final   Report Status PENDING  Incomplete  MRSA PCR Screening     Status: None   Collection Time: 01/24/15  3:46 AM  Result Value Ref Range Status   MRSA by PCR NEGATIVE NEGATIVE Final    Comment:        The GeneXpert MRSA Assay (FDA approved for NASAL specimens only), is one component of a comprehensive MRSA colonization surveillance program. It is not intended to diagnose MRSA infection nor to guide or monitor treatment for MRSA infections.      Studies: Dg Chest Port 1 View  01/23/2015   CLINICAL  DATA:  Fever, abdominal pain, difficulty breathing. History of pancreatic cancer  EXAM: PORTABLE CHEST - 1 VIEW  COMPARISON:  CT chest dated 08/08/2014  FINDINGS: Mild patchy opacity in the left lower lobe, atelectasis versus pneumonia. Underlying chronic interstitial markings. No pleural effusion or pneumothorax.  The heart is normal in size.  Right chest power port terminating at the cavoatrial junction.  IMPRESSION: Mild patchy opacity in the left lower lobe, atelectasis versus pneumonia.   Electronically Signed   By: Julian Hy M.D.   On: 01/23/2015 21:28    Scheduled Meds: . clopidogrel  75 mg Oral Daily  . Darbepoetin Alfa  200 mcg Subcutaneous Once  . doxycycline  100 mg Oral Q12H  . ferrous sulfate  325 mg Oral BID WC  . folic acid  1 mg Oral Daily  . heparin  5,000 Units Subcutaneous 3 times per day  . levETIRAcetam  500 mg Oral BID  . megestrol  400 mg Oral BID  . phenytoin  300 mg Oral Daily  . piperacillin-tazobactam (ZOSYN)  IV  3.375 g Intravenous 3 times per day  . potassium chloride  40 mEq Oral BID  . senna-docusate  2 tablet Oral BID  . vancomycin  1,250 mg Intravenous Q12H   Continuous Infusions:   Assessment/Plan:  1. Clinical sepsis, pneumonia left lower lobe. Since the patient is from [place], triple antibiotic therapy will be prescribed. Vancomycin, Zosyn, I added doxycycline today. So far blood cultures are negative. 2. Stage IV non-small cell lung cancer. Overall prognosis poor. Patient is a DO NOT RESUSCITATE. 3. 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 4. Seizure disorder on phenytoin and Keppra. 5. Anemia of chronic disease; I will DC IV fluid hydration to prevent dilution, I will start iron and Procrit. 6. Hypokalemia prescribed potassium chloride 40 mEq twice a day like he does at home.     Code Status:     Code Status Orders        Start     Ordered   01/24/15 0015  Do  not attempt resuscitation (DNR)   Continuous    Question Answer Comment  In the event of cardiac or respiratory ARREST Do not call a "code blue"   In the event of cardiac or respiratory ARREST Do not perform Intubation, CPR, defibrillation or ACLS   In the event of cardiac or respiratory ARREST Use medication by any route, position, wound care, and other measures to relive  pain and suffering. May use oxygen, suction and manual treatment of airway obstruction as needed for comfort.      01/24/15 0014    Advance Directive Documentation        Most Recent Value   Type of Advance Directive  Out of facility DNR (pink MOST or yellow form)   Pre-existing out of facility DNR order (yellow form or pink MOST form)  Yellow form placed in chart (order not valid for inpatient use)   "MOST" Form in Place?       Disposition Plan: Back to [facility] when stable  Time spent: 25 minutes, discussed with care management team.  Loletha Grayer  [facility]

## 2015-01-24 NOTE — Progress Notes (Addendum)
ANTIBIOTIC CONSULT NOTE - INITIAL  Pharmacy Consult for vancomycin and Zosyn dosing  Indication: sepsis  No Known Allergies  Patient Measurements: Height: 6' (182.9 cm) Weight: 151 lb 4.8 oz (68.629 kg) IBW/kg (Calculated) : 77.6 Adjusted Body Weight: 74.8 kg  Vital Signs: Temp: 97.8 F (36.6 C) (05/19 0023) Temp Source: Oral (05/19 0023) BP: 112/61 mmHg (05/19 0023) Pulse Rate: 115 (05/19 0023) Intake/Output from previous day:   Intake/Output from this shift:    Labs:  Recent Labs  01/23/15 2049 01/23/15 2344  WBC  --  6.0  HGB  --  7.4*  PLT  --  114*  CREATININE 0.88 0.82   Estimated Creatinine Clearance: 81.3 mL/min (by C-G formula based on Cr of 0.82). No results for input(s): VANCOTROUGH, VANCOPEAK, VANCORANDOM, GENTTROUGH, GENTPEAK, GENTRANDOM, TOBRATROUGH, TOBRAPEAK, TOBRARND, AMIKACINPEAK, AMIKACINTROU, AMIKACIN in the last 72 hours.   Microbiology: No results found for this or any previous visit (from the past 720 hour(s)).  Medical History: Past Medical History  Diagnosis Date  . Lung cancer   . Stroke   . Seizures   . Expressive aphasia     Medications:   Assessment: Blood and urine cx pending CXR: LLL opacities, atelectasis vs. pneumonia  Goal of Therapy:  Vancomycin trough level 15-20 mcg/ml  Plan:  TBW 74.8kg  IBW 77.6kg  DW 74.8kg  Vd 52L kei 0.074 hr-1  t1/2 9 hours 1 gram given in ED. Stacked dose of 1250 mg ordered for 03:00 and then q 12 hours. Level ordered for 14:30, 5/20 before 5th dose.  Zosyn 3.375 grams x1 given in ED. 4.5 grams q 8 hours ordered as continuation for Pseudomonas risk of pt on chemotherapy.  Angie Piercey S 01/24/2015,12:37 AM

## 2015-01-25 LAB — CREATININE, SERUM
Creatinine, Ser: 0.91 mg/dL (ref 0.61–1.24)
GFR calc Af Amer: >60 mL/min (ref >60)

## 2015-01-25 MED ORDER — FERROUS SULFATE 325 (65 FE) MG PO TABS: 325.0000 mg | ORAL_TABLET | Freq: Two times a day (BID) | ORAL | Status: AC

## 2015-01-25 MED ORDER — LEVOFLOXACIN 500 MG PO TABS
500.0000 mg | ORAL_TABLET | Freq: Every day | ORAL | Status: DC
  Administered 2015-01-25: 13:00:00 500 mg via ORAL
  Filled 2015-01-25: qty 1

## 2015-01-25 MED ORDER — LEVOFLOXACIN 500 MG PO TABS
500.0000 mg | ORAL_TABLET | Freq: Every day | ORAL | Status: AC
Start: 2015-01-25 — End: 2015-02-01

## 2015-01-25 NOTE — Plan of Care (Signed)
Problem: Discharge Progression Outcomes Goal: Discharge plan in place and appropriate Individualization:  Pt prefers to be called Mr. Sthilaire and lives at [facility] facility. 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 Hx: stroke w/ right sided weakness, seizure & expressive aphasia, controlled by medications. Pt is a high fall risk and understands how to use call light button when he needs assistance or to be changed since he is incontinent of bowel & bladder    Goal: Other Discharge Outcomes/Goals Plan of care progress to goals: 1. Pain: no c/o pain during the night  2. Hemodynamically:              -Vitals stable, HR Sinus Tachycardia on telemetry, afebrile.              -IV Abx given as scheduled.              -Hgb 7.1 due to chemo induced anemia, procrit given as ordered  3. Complications: Unable to get stool sample despite 2 occurrences going through incontinent pad 4. Activity: pt requires assistance moving in bed which is normal at his baseline No fall or injury this shift. Will continue to assess

## 2015-01-25 NOTE — Clinical Social Work Note (Signed)
Pt is ready for discharge today and will return to [facility]. Pt's daughter is aware and agreeable to discharge plan. RN called report and EMS will provide transportation. CSW is signing off as no further needs identified.   Darden Dates, MSW, LCSW Clinical Social Worker  [phone]

## 2015-01-25 NOTE — Discharge Summary (Signed)
[facility] [place] NAME: Brian Khan    MR#:  [id number]  DATE OF BIRTH:  October 18, 1945  DATE OF ADMISSION:  01/23/2015 ADMITTING PHYSICIAN: Lytle Butte, MD  DATE OF DISCHARGE: 01/25/2015  PRIMARY CARE PHYSICIAN: Marden Noble, MD    ADMISSION DIAGNOSIS:  Healthcare-associated pneumonia [J18.9] Sepsis, due to unspecified organism [A41.9]  DISCHARGE DIAGNOSIS:  Principal Problem:   Sepsis   SECONDARY DIAGNOSIS:   Past Medical History  Diagnosis Date  . Lung cancer   . Stroke   . Seizures   . Expressive aphasia     HOSPITAL COURSE:   1. Clinical sepsis, left lower lobe pneumonia. Patient was started on triple antibiotic therapy since the patient did come from [place]. Patient's vitals are stable. No fever. Switched over to Levaquin to complete a 10 day course. 2. 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 Follow-up with Dr. Grayland Ormond as outpatient. 3. Anemia of chronic disease- anemia also likely dilutional. IV fluids were stopped. Procrit given. Iron prescribed. 4. Diarrhea patient had a few episodes of diarrhea yesterday. I ordered a stool for C. difficile. Nursing staff unable to obtain at this point. I had the nurse call over to [place] they reported some loose stools also. Lactulose and senna DC'd. 5. History of stroke with a right arm paralysis and left leg weakness and aphasia-patient is on Plavix. Can consider statin as outpatient. 6. Seizures-no seizure here continue usual medications.  DISCHARGE CONDITIONS:   Fair  CONSULTS OBTAINED:  Treatment Team:  Lytle Butte, MD Lloyd Huger, MD Lequita Asal, MD  DRUG ALLERGIES:  No Known Allergies  DISCHARGE MEDICATIONS:   Current Discharge Medication List    START taking these medications   Details  ferrous sulfate 325 (65 FE) MG tablet Take 1 tablet (325 mg total) by mouth 2 (two) times daily with a  meal. Qty: 60 tablet, Refills: 0    levofloxacin (LEVAQUIN) 500 MG tablet Take 1 tablet (500 mg total) by mouth daily. Qty: 7 tablet, Refills: 0      CONTINUE these medications which have NOT CHANGED   Details  Cholecalciferol (VITAMIN D3) 50000 UNITS CAPS Take 1 capsule by mouth once a week.     clopidogrel (PLAVIX) 75 MG tablet Take 75 mg by mouth daily.    folic acid (FOLVITE) 1 MG tablet Take 1 mg by mouth daily.    levETIRAcetam (KEPPRA) 500 MG tablet Take 500 mg by mouth 2 (two) times daily.    megestrol (MEGACE) 40 MG/ML suspension Take 400 mg by mouth 2 (two) times daily.    phenytoin (DILANTIN) 100 MG ER capsule Take 300 mg by mouth daily.     potassium chloride SA (K-DUR,KLOR-CON) 20 MEQ tablet Take 40 mEq by mouth 2 (two) times daily.    prochlorperazine (COMPAZINE) 10 MG tablet Take 10 mg by mouth every 6 (six) hours as needed for nausea or vomiting.    Skin Protectants, Misc. (EUCERIN) cream Apply 1 application topically 2 (two) times daily as needed for dry skin.       STOP taking these medications     lactulose (CHRONULAC) 10 GM/15ML solution      senna-docusate (SENOKOT-S) 8.6-50 MG per tablet      lidocaine-prilocaine (EMLA) cream          DISCHARGE INSTRUCTIONS:   Follow-up with Dr. Brynda Greathouse in 1-2 days.  If you experience worsening of your admission symptoms, develop  shortness of breath, life threatening emergency, suicidal or homicidal thoughts you must seek medical attention immediately by calling 911 or calling your MD immediately  if symptoms less severe.  You Must read complete instructions/literature along with all the possible adverse reactions/side effects for all the Medicines you take and that have been prescribed to you. Take any new Medicines after you have completely understood and accept all the possible adverse reactions/side effects.   Please note  You were cared for by a hospitalist during your hospital stay. If you have any questions  about your discharge medications or the care you received while you were in the hospital after you are discharged, you can call the unit and asked to speak with the hospitalist on call if the hospitalist that took care of you is not available. Once you are discharged, your primary care physician will handle any further medical issues. Please note that NO REFILLS for any discharge medications will be authorized once you are discharged, as it is imperative that you return to your primary care physician (or establish a relationship with a primary care physician if you do not have one) for your aftercare needs so that they can reassess your need for medications and monitor your lab values.    Today   CHIEF COMPLAINT:   Chief Complaint  Patient presents with  . Fever    HISTORY OF PRESENT ILLNESS:  Brian Khan  is a 69 y.o. male with a known history of stroke with aphasia and right arm paralysis right leg weakness, seizure disorder presented to the ER with fever and clinical sepsis and found to have a pneumonia.   VITAL SIGNS:  Blood pressure 101/65, pulse 98, temperature 98.3 F (36.8 C), temperature source Oral, resp. rate 16, height 6' (1.829 m), weight 68.629 kg (151 lb 4.8 oz), SpO2 97 %.  I/O:   Intake/Output Summary (Last 24 hours) at 01/25/15 1109 Last data filed at 01/24/15 1300  Gross per 24 hour  Intake    120 ml  Output      0 ml  Net    120 ml    PHYSICAL EXAMINATION:  GENERAL:  69 y.o.-year-old patient lying in the bed with no acute distress.  EYES: Pupils equal, round, reactive to light and accommodation. No scleral icterus. Extraocular muscles intact.  HEENT: Head atraumatic, normocephalic. Oropharynx and nasopharynx clear.  NECK:  Supple, no jugular venous distention. No thyroid enlargement, no tenderness.  LUNGS: Normal breath sounds bilaterally, no wheezing, rales,rhonchi or crepitation. No use of accessory muscles of respiration.  CARDIOVASCULAR: S1, S2  tachycardic. No murmurs, rubs, or gallops.  ABDOMEN: Soft, non-tender, non-distended. Bowel sounds present. No organomegaly or mass.  EXTREMITIES: No pedal edema, cyanosis, or clubbing.  NEUROLOGIC: Aphasia, right arm paralysis, right leg weakness. PSYCHIATRIC: The patient is alert and oriented x 3.  SKIN: No obvious rash, lesion, or ulcer.   DATA REVIEW:   CBC  Recent Labs Lab 01/24/15 0550  WBC 5.1  HGB 7.1*  HCT 20.9*  PLT 116*    Chemistries   Recent Labs Lab 01/23/15 2049  01/24/15 0550 01/25/15 0608  NA 135  --  135  --   K 4.3  --  3.5  --   CL 108  --  111  --   CO2 20*  --  20*  --   GLUCOSE 122*  --  105*  --   BUN 15  --  12  --   CREATININE 0.88  < >  0.79 0.91  CALCIUM 8.9  --  7.8*  --   AST 23  --   --   --   ALT 18  --   --   --   ALKPHOS 95  --   --   --   BILITOT 0.7  --   --   --   < > = values in this interval not displayed.   RADIOLOGY:  Dg Chest Port 1 View  01/23/2015   CLINICAL DATA:  Fever, abdominal pain, difficulty breathing. History of pancreatic cancer  EXAM: PORTABLE CHEST - 1 VIEW  COMPARISON:  CT chest dated 08/08/2014  FINDINGS: Mild patchy opacity in the left lower lobe, atelectasis versus pneumonia. Underlying chronic interstitial markings. No pleural effusion or pneumothorax.  The heart is normal in size.  Right chest power port terminating at the cavoatrial junction.  IMPRESSION: Mild patchy opacity in the left lower lobe, atelectasis versus pneumonia.   Electronically Signed   By: Julian Hy M.D.   On: 01/23/2015 21:28    Management plans discussed with the patient, and he is in agreement.  CODE STATUS:     Code Status Orders        Start     Ordered   01/24/15 0015  Do not attempt resuscitation (DNR)   Continuous    Question Answer Comment  In the event of cardiac or respiratory ARREST Do not call a "code blue"   In the event of cardiac or respiratory ARREST Do not perform Intubation, CPR, defibrillation or ACLS    In the event of cardiac or respiratory ARREST Use medication by any route, position, wound care, and other measures to relive pain and suffering. May use oxygen, suction and manual treatment of airway obstruction as needed for comfort.      01/24/15 0014    Advance Directive Documentation        Most Recent Value   Type of Advance Directive  Out of facility DNR (pink MOST or yellow form)   Pre-existing out of facility DNR order (yellow form or pink MOST form)  Yellow form placed in chart (order not valid for inpatient use)   "MOST" Form in Place?        TOTAL TIME TAKING CARE OF THIS PATIENT: 40 minutes.    Loletha Grayer M.D on 01/25/2015 at 11:09 AM  Between 7am to 6pm - Pager - [phone]  After 6pm go to [email or web address] - password [email or web address]  Office  [phone]  CC: Primary care physician; Marden Noble, MD

## 2015-01-25 NOTE — Consult Note (Signed)
ID: Brian Khan OB: 02-03-1946  MR#: [id number]  CSN#:[id number]  Patient Care Team: Marden Noble, MD as PCP - General (Internal Medicine)  CHIEF COMPLAINT:  Chief Complaint  Patient presents with  . Fever    INTERVAL HISTORY: Patient recently received chemotherapy for his stage IV squamous cell carcinoma of the lung with carboplatinum and Taxol one week ago. In the last 1-2 days he developed pain, shortness of breath, and fever. Given his expressive aphasia it was difficult to obtain any further review of systems. Currently, he feels significantly improved and offers no complaints.  REVIEW OF SYSTEMS:   Review of Systems  Constitutional: Positive for fever (none currently).  Respiratory: Positive for cough and shortness of breath.   Cardiovascular: Negative.   Gastrointestinal: Negative.     As per HPI. Otherwise, a complete review of systems is negatve.  PAST MEDICAL HISTORY: Past Medical History  Diagnosis Date  . Lung cancer   . Stroke   . Seizures   . Expressive aphasia     PAST SURGICAL HISTORY: Past Surgical History  Procedure Laterality Date  . Partial colectomy      FAMILY HISTORY Family History  Problem Relation Age of Onset  . Diabetes Mother        ADVANCED DIRECTIVES:    HEALTH MAINTENANCE: History  Substance Use Topics  . Smoking status: Former Research scientist (life sciences)  . Smokeless tobacco: Never Used  . Alcohol Use: No     Colonoscopy:  PAP:  Bone density:  Lipid panel:  No Known Allergies  Current Facility-Administered Medications  Medication Dose Route Frequency Provider Last Rate Last Dose  . acetaminophen (TYLENOL) tablet 650 mg  650 mg Oral Q6H PRN Lytle Butte, MD       Or  . acetaminophen (TYLENOL) suppository 650 mg  650 mg Rectal Q6H PRN Lytle Butte, MD      . clopidogrel (PLAVIX) tablet 75 mg  75 mg Oral Daily Lytle Butte, MD   75 mg at 01/24/15 1001  . Darbepoetin Alfa (ARANESP) injection 200 mcg  200 mcg Subcutaneous Once Loletha Grayer, MD      . doxycycline (VIBRA-TABS) tablet 100 mg  100 mg Oral Q12H Richard Leslye Peer, MD      . ferrous sulfate tablet 325 mg  325 mg Oral BID WC Loletha Grayer, MD   325 mg at 01/24/15 1620  . folic acid (FOLVITE) tablet 1 mg  1 mg Oral Daily Lytle Butte, MD   1 mg at 01/24/15 1001  . heparin injection 5,000 Units  5,000 Units Subcutaneous 3 times per day Lytle Butte, MD   5,000 Units at 01/24/15 1000  . hydrocerin (EUCERIN) cream 1 application  1 application Topical BID PRN Lytle Butte, MD      . lactulose (CHRONULAC) 10 GM/15ML solution 10 g  10 g Oral Daily PRN Lytle Butte, MD      . levETIRAcetam (KEPPRA) tablet 500 mg  500 mg Oral BID Lytle Butte, MD   500 mg at 01/24/15 1001  . megestrol (MEGACE) 40 MG/ML suspension 400 mg  400 mg Oral BID Lytle Butte, MD   400 mg at 01/24/15 0116  . morphine 2 MG/ML injection 2 mg  2 mg Intravenous Q4H PRN Lytle Butte, MD      . ondansetron [facility]) tablet 4 mg  4 mg Oral Q6H PRN Lytle Butte, MD       Or  . ondansetron (  ZOFRAN) injection 4 mg  4 mg Intravenous Q6H PRN Lytle Butte, MD      . phenytoin (DILANTIN) ER capsule 300 mg  300 mg Oral Daily Lytle Butte, MD   300 mg at 01/24/15 1000  . piperacillin-tazobactam (ZOSYN) IVPB 3.375 g  3.375 g Intravenous 3 times per day Lytle Butte, MD   3.375 g at 01/24/15 1620  . potassium chloride SA (K-DUR,KLOR-CON) CR tablet 40 mEq  40 mEq Oral BID Loletha Grayer, MD      . senna-docusate (Senokot-S) tablet 2 tablet  2 tablet Oral BID Lytle Butte, MD   2 tablet at 01/24/15 1000  . vancomycin (VANCOCIN) 1,250 mg in sodium chloride 0.9 % 250 mL IVPB  1,250 mg Intravenous Q12H Lytle Butte, MD   1,250 mg at 01/24/15 1619    OBJECTIVE: Filed Vitals:   01/24/15 1435  BP: 95/61  Pulse: 104  Temp: 97.8 F (36.6 C)  Resp: 20     Body mass index is 20.52 kg/(m^2).    ECOG FS:1 - Symptomatic but completely ambulatory  General: Well-developed, well-nourished, no acute  distress. Eyes: anicteric sclera. Lungs: Clear to auscultation bilaterally. Heart: Regular rate and rhythm. No rubs, murmurs, or gallops. Abdomen: Soft, nontender, nondistended. No organomegaly noted, normoactive bowel sounds. Musculoskeletal: No edema, cyanosis, or clubbing. Neuro: Alert, answering all questions appropriately. Cranial nerves grossly intact. Skin: No rashes or petechiae noted. Psych: Normal affect.    LAB RESULTS:  Lab Results  Component Value Date   NA 135 01/24/2015   K 3.5 01/24/2015   CL 111 01/24/2015   CO2 20* 01/24/2015   GLUCOSE 105* 01/24/2015   BUN 12 01/24/2015   CREATININE 0.79 01/24/2015   CALCIUM 7.8* 01/24/2015   PROT 7.6 01/23/2015   ALBUMIN 3.6 01/23/2015   AST 23 01/23/2015   ALT 18 01/23/2015   ALKPHOS 95 01/23/2015   BILITOT 0.7 01/23/2015   GFRNONAA >60 01/24/2015   GFRAA >60 01/24/2015    Lab Results  Component Value Date   WBC 5.1 01/24/2015   NEUTROABS 5.5 01/17/2015   HGB 7.1* 01/24/2015   HCT 20.9* 01/24/2015   MCV 94.0 01/24/2015   PLT 116* 01/24/2015     STUDIES: Dg Chest Port 1 View  01/23/2015   CLINICAL DATA:  Fever, abdominal pain, difficulty breathing. History of pancreatic cancer  EXAM: PORTABLE CHEST - 1 VIEW  COMPARISON:  CT chest dated 08/08/2014  FINDINGS: Mild patchy opacity in the left lower lobe, atelectasis versus pneumonia. Underlying chronic interstitial markings. No pleural effusion or pneumothorax.  The heart is normal in size.  Right chest power port terminating at the cavoatrial junction.  IMPRESSION: Mild patchy opacity in the left lower lobe, atelectasis versus pneumonia.   Electronically Signed   By: Julian Hy M.D.   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  PLAN:    1. Lung cancer: PET results reviewed independently. MRI the brain is negative. EUS pathology confirms metastatic  disease to the pancreas. Patient received cycle 3 of 6 of carboplatinum and Taxol one week ago. He has been instructed to keep his previously scheduled follow-up appointment in 2 weeks for consideration of cycle 4.  2. Pancreatic mass: PET and biopsy confirm metastatic lesion. Chemotherapy as above. 3. Hypokalemia: Potassium now within normal limits. Continue oral supplementation as prescribed. 4. Reduced appetite: Continue Megace as prescribed. 5.  Pneumonia/sepsis: Blood and urine cultures negative to date. Agree with current antibiotics.  Okay to discharge from an oncology standpoint when patient's symptoms resolve. Follow-up in 2 weeks as above. Appreciate consult, call with questions.   Lloyd Huger, MD   01/24/2015 5:41 PM

## 2015-01-27 LAB — URINE CULTURE

## 2015-01-29 LAB — CULTURE, BLOOD (ROUTINE X 2)
Culture: NO GROWTH
Culture: NO GROWTH

## 2015-02-06 ENCOUNTER — Other Ambulatory Visit: Payer: Self-pay | Admitting: Oncology

## 2015-02-07 ENCOUNTER — Inpatient Hospital Stay (HOSPITAL_BASED_OUTPATIENT_CLINIC_OR_DEPARTMENT_OTHER): Payer: Medicare Other | Admitting: Oncology

## 2015-02-07 ENCOUNTER — Inpatient Hospital Stay: Payer: Medicare Other

## 2015-02-07 ENCOUNTER — Inpatient Hospital Stay: Payer: Medicare Other | Attending: Oncology

## 2015-02-07 VITALS — BP 110/70 | HR 83 | Temp 97.1°F | Resp 20

## 2015-02-07 DIAGNOSIS — E876 Hypokalemia: Secondary | ICD-10-CM

## 2015-02-07 DIAGNOSIS — C3432 Malignant neoplasm of lower lobe, left bronchus or lung: Secondary | ICD-10-CM

## 2015-02-07 DIAGNOSIS — R531 Weakness: Secondary | ICD-10-CM | POA: Diagnosis not present

## 2015-02-07 DIAGNOSIS — C7889 Secondary malignant neoplasm of other digestive organs: Secondary | ICD-10-CM | POA: Insufficient documentation

## 2015-02-07 DIAGNOSIS — R5383 Other fatigue: Secondary | ICD-10-CM | POA: Insufficient documentation

## 2015-02-07 DIAGNOSIS — Z5111 Encounter for antineoplastic chemotherapy: Secondary | ICD-10-CM | POA: Insufficient documentation

## 2015-02-07 DIAGNOSIS — Z8673 Personal history of transient ischemic attack (TIA), and cerebral infarction without residual deficits: Secondary | ICD-10-CM | POA: Insufficient documentation

## 2015-02-07 DIAGNOSIS — C3402 Malignant neoplasm of left main bronchus: Secondary | ICD-10-CM

## 2015-02-07 DIAGNOSIS — Z418 Encounter for other procedures for purposes other than remedying health state: Secondary | ICD-10-CM | POA: Diagnosis not present

## 2015-02-07 DIAGNOSIS — C3490 Malignant neoplasm of unspecified part of unspecified bronchus or lung: Secondary | ICD-10-CM | POA: Insufficient documentation

## 2015-02-07 DIAGNOSIS — Z79899 Other long term (current) drug therapy: Secondary | ICD-10-CM

## 2015-02-07 DIAGNOSIS — R630 Anorexia: Secondary | ICD-10-CM | POA: Insufficient documentation

## 2015-02-07 LAB — CBC WITH DIFFERENTIAL/PLATELET
BASOS PCT: 1 %
Basophils Absolute: 0.1 10*3/uL (ref 0–0.1)
EOS ABS: 0.1 10*3/uL (ref 0–0.7)
Eosinophils Relative: 1 %
HEMATOCRIT: 29 % — AB (ref 40.0–52.0)
HEMOGLOBIN: 9.8 g/dL — AB (ref 13.0–18.0)
LYMPHS ABS: 2.8 10*3/uL (ref 1.0–3.6)
Lymphocytes Relative: 27 %
MCH: 33 pg (ref 26.0–34.0)
MCHC: 33.7 g/dL (ref 32.0–36.0)
MCV: 97.9 fL (ref 80.0–100.0)
MONO ABS: 1.4 10*3/uL — AB (ref 0.2–1.0)
MONOS PCT: 13 %
NEUTROS ABS: 6.1 10*3/uL (ref 1.4–6.5)
Neutrophils Relative %: 58 %
Platelets: 208 10*3/uL (ref 150–440)
RBC: 2.97 MIL/uL — AB (ref 4.40–5.90)
RDW: 20.6 % — AB (ref 11.5–14.5)
WBC: 10.5 10*3/uL (ref 3.8–10.6)

## 2015-02-07 LAB — COMPREHENSIVE METABOLIC PANEL
ALT: 16 U/L — ABNORMAL LOW (ref 17–63)
ANION GAP: 5 (ref 5–15)
AST: 23 U/L (ref 15–41)
Albumin: 3.8 g/dL (ref 3.5–5.0)
Alkaline Phosphatase: 80 U/L (ref 38–126)
BILIRUBIN TOTAL: 0.4 mg/dL (ref 0.3–1.2)
BUN: 14 mg/dL (ref 6–20)
CO2: 21 mmol/L — ABNORMAL LOW (ref 22–32)
Calcium: 8.3 mg/dL — ABNORMAL LOW (ref 8.9–10.3)
Chloride: 107 mmol/L (ref 101–111)
Creatinine, Ser: 0.77 mg/dL (ref 0.61–1.24)
GFR calc non Af Amer: >60 mL/min (ref >60)
Glucose, Bld: 123 mg/dL — ABNORMAL HIGH (ref 65–99)
POTASSIUM: 3.4 mmol/L — AB (ref 3.5–5.1)
Sodium: 133 mmol/L — ABNORMAL LOW (ref 135–145)
Total Protein: 7.8 g/dL (ref 6.5–8.1)

## 2015-02-07 MED ORDER — SODIUM CHLORIDE 0.9 % IV SOLN
492.5000 mg | Freq: Once | INTRAVENOUS | Status: AC
  Administered 2015-02-07: 490 mg via INTRAVENOUS
  Filled 2015-02-07: qty 49

## 2015-02-07 MED ORDER — SODIUM CHLORIDE 0.9 % IV SOLN: Freq: Once | INTRAVENOUS | Status: DC

## 2015-02-07 MED ORDER — PACLITAXEL CHEMO INJECTION 300 MG/50ML
175.0000 mg/m2 | Freq: Once | INTRAVENOUS | Status: AC
  Administered 2015-02-07: 336 mg via INTRAVENOUS
  Filled 2015-02-07: qty 56

## 2015-02-07 MED ORDER — SODIUM CHLORIDE 0.9 % IV SOLN
Freq: Once | INTRAVENOUS | Status: AC
  Administered 2015-02-07: 12:00:00 via INTRAVENOUS
  Filled 2015-02-07: qty 5

## 2015-02-07 MED ORDER — SODIUM CHLORIDE 0.9 % IV SOLN
Freq: Once | INTRAVENOUS | Status: AC
  Administered 2015-02-07: 12:00:00 via INTRAVENOUS
  Filled 2015-02-07: qty 4

## 2015-02-07 MED ORDER — DIPHENHYDRAMINE HCL 50 MG/ML IJ SOLN
25.0000 mg | Freq: Once | INTRAMUSCULAR | Status: AC
  Administered 2015-02-07: 25 mg via INTRAVENOUS
  Filled 2015-02-07: qty 1

## 2015-02-07 MED ORDER — FAMOTIDINE IN NACL 20-0.9 MG/50ML-% IV SOLN
20.0000 mg | Freq: Once | INTRAVENOUS | Status: AC
  Administered 2015-02-07: 20 mg via INTRAVENOUS

## 2015-02-07 MED ORDER — SODIUM CHLORIDE 0.9 % IV SOLN
Freq: Once | INTRAVENOUS | Status: AC
  Administered 2015-02-07: 11:00:00 via INTRAVENOUS
  Filled 2015-02-07: qty 1000

## 2015-02-07 MED ORDER — HEPARIN SOD (PORK) LOCK FLUSH 100 UNIT/ML IV SOLN
500.0000 [IU] | Freq: Once | INTRAVENOUS | Status: AC | PRN
  Administered 2015-02-07: 500 [IU]
  Filled 2015-02-07 (×2): qty 5

## 2015-02-07 NOTE — Progress Notes (Signed)
Patient here today for ongoing follow up and treatment consideration regarding lung cancer. Patient reports improvement in appetite, feeling stronger since being discharged from the hospital.

## 2015-02-09 ENCOUNTER — Ambulatory Visit: Payer: Medicare Other

## 2015-02-09 VITALS — BP 105/71 | HR 89 | Temp 98.2°F

## 2015-02-09 DIAGNOSIS — Z5111 Encounter for antineoplastic chemotherapy: Secondary | ICD-10-CM | POA: Diagnosis not present

## 2015-02-09 MED ORDER — PEGFILGRASTIM INJECTION 6 MG/0.6ML ~~LOC~~
6.0000 mg | PREFILLED_SYRINGE | Freq: Once | SUBCUTANEOUS | Status: AC
  Administered 2015-02-09: 6 mg via SUBCUTANEOUS

## 2015-02-19 NOTE — Progress Notes (Signed)
[facility]  [phone] [phone]  ID: Brian Khan OB: [birth year]  MR#: [id number]  EXB#:[id number]  Patient Care Team: Marden Noble, MD as PCP - General (Internal Medicine)  CHIEF COMPLAINT:  Chief Complaint  Patient presents with  . Follow-up    lung cancer  . Chemotherapy    INTERVAL HISTORY: Patient is here for further evaluation and consideration of cycle 4 of carboplatinum and Taxol. The entire history is given by his brother secondary to patient's expressive aphasia. He was recently admitted to the hospital with fever and pneumonia, but now is fully recovered. He currently feels well. He has no recent fevers. He has no chest pain. He denies any shortness of breath, cough, or hemoptysis. He has a fair appetite, but denies any nausea, vomiting, cons patient, or diarrhea. He has no urinary complaints. Patient offers no specific complaints today.   REVIEW OF SYSTEMS:   Review of Systems  Constitutional: Positive for malaise/fatigue.  Respiratory: Negative.   Cardiovascular: Negative.   Neurological: Positive for weakness.    As per HPI. Otherwise, a complete review of systems is negatve.  PAST MEDICAL HISTORY: Past Medical History  Diagnosis Date  . Lung cancer   . Stroke   . Seizures   . Expressive aphasia     PAST SURGICAL HISTORY: Past Surgical History  Procedure Laterality Date  . Partial colectomy      FAMILY HISTORY Family History  Problem Relation Age of Onset  . Diabetes Mother        ADVANCED DIRECTIVES:    HEALTH MAINTENANCE: History  Substance Use Topics  . Smoking status: Former Research scientist (life sciences)  . Smokeless tobacco: Never Used  . Alcohol Use: No     Colonoscopy:  PAP:  Bone density:  Lipid panel:  No Known Allergies  Current Outpatient Prescriptions  Medication Sig Dispense Refill  . Cholecalciferol (VITAMIN D3) 50000 UNITS CAPS Take 1 capsule by mouth once a week.     . clopidogrel (PLAVIX)  75 MG tablet Take 75 mg by mouth daily.    . ferrous sulfate 325 (65 FE) MG tablet Take 1 tablet (325 mg total) by mouth 2 (two) times daily with a meal. 60 tablet 0  . folic acid (FOLVITE) 1 MG tablet Take 1 mg by mouth daily.    Marland Kitchen levETIRAcetam (KEPPRA) 500 MG tablet Take 500 mg by mouth 2 (two) times daily.    . megestrol (MEGACE) 40 MG/ML suspension Take 400 mg by mouth 2 (two) times daily.    . phenytoin (DILANTIN) 100 MG ER capsule Take 300 mg by mouth daily.     . potassium chloride SA (K-DUR,KLOR-CON) 20 MEQ tablet Take 40 mEq by mouth 2 (two) times daily.    . prochlorperazine (COMPAZINE) 10 MG tablet Take 10 mg by mouth every 6 (six) hours as needed for nausea or vomiting.    . Skin Protectants, Misc. (EUCERIN) cream Apply 1 application topically 2 (two) times daily as needed for dry skin.      No current facility-administered medications for this visit.    OBJECTIVE: Filed Vitals:   02/07/15 1043  BP: 110/70  Pulse: 83  Temp: 97.1 F (36.2 C)  Resp: 20     There is no weight on file to calculate BMI.    ECOG FS:1 - Symptomatic but completely ambulatory  General: Well-developed, well-nourished, no acute distress. Eyes: anicteric sclera. Lungs: Clear to auscultation bilaterally. Heart: Regular rate and rhythm. No rubs, murmurs, or  gallops. Abdomen: Soft, nontender, nondistended. No organomegaly noted, normoactive bowel sounds. Musculoskeletal: No edema, cyanosis, or clubbing. Neuro: Alert, answering all questions appropriately. Cranial nerves grossly intact. Skin: No rashes or petechiae noted. Psych: Normal affect.    LAB RESULTS:  Lab Results  Component Value Date   NA 133* 02/07/2015   K 3.4* 02/07/2015   CL 107 02/07/2015   CO2 21* 02/07/2015   GLUCOSE 123* 02/07/2015   BUN 14 02/07/2015   CREATININE 0.77 02/07/2015   CALCIUM 8.3* 02/07/2015   PROT 7.8 02/07/2015   ALBUMIN 3.8 02/07/2015   AST 23 02/07/2015   ALT 16* 02/07/2015   ALKPHOS 80 02/07/2015    BILITOT 0.4 02/07/2015   GFRNONAA >60 02/07/2015   GFRAA >60 02/07/2015    Lab Results  Component Value Date   WBC 10.5 02/07/2015   NEUTROABS 6.1 02/07/2015   HGB 9.8* 02/07/2015   HCT 29.0* 02/07/2015   MCV 97.9 02/07/2015   PLT 208 02/07/2015     STUDIES: Dg Chest Port 1 View  01/23/2015   CLINICAL DATA:  Fever, abdominal pain, difficulty breathing. History of pancreatic cancer  EXAM: PORTABLE CHEST - 1 VIEW  COMPARISON:  CT chest dated 08/08/2014  FINDINGS: Mild patchy opacity in the left lower lobe, atelectasis versus pneumonia. Underlying chronic interstitial markings. No pleural effusion or pneumothorax.  The heart is normal in size.  Right chest power port terminating at the cavoatrial junction.  IMPRESSION: Mild patchy opacity in the left lower lobe, atelectasis versus pneumonia.   Electronically Signed   By: Julian Hy M.D.   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  PLAN:    1. Lung cancer: PET results reviewed independently. MRI the brain is negative. EUS pathology confirms metastatic disease to the pancreas. Proceed with cycle 4 of 6 of carboplatinum and Taxol today. Return to clinic tomorrow for Neulasta and then in 3 weeks for consideration of cycle 5. Patient will receive this regimen every 3 weeks for 6 cycles. Will get restaging PET scan prior to next treatment.  2. Pancreatic mass: PET and biopsy confirm metastatic lesion. Chemotherapy and PET as above. 3. Hypokalemia: Continue oral supplementation as prescribed. 4. Reduced appetite: Continue Megace as prescribed.   Patient expressed understanding and was in agreement with this plan. He also understands that He can call clinic at any time with any questions, concerns, or complaints.   No matching staging information was found for the patient.  Lloyd Huger, MD   02/19/2015 12:25 PM

## 2015-02-25 ENCOUNTER — Ambulatory Visit
Admission: RE | Admit: 2015-02-25 | Discharge: 2015-02-25 | Disposition: A | Discharge status: Home / Self Care | Payer: Medicare Other | Source: Ambulatory Visit | Attending: Oncology | Admitting: Oncology

## 2015-02-25 DIAGNOSIS — R911 Solitary pulmonary nodule: Secondary | ICD-10-CM | POA: Diagnosis not present

## 2015-02-25 DIAGNOSIS — C3402 Malignant neoplasm of left main bronchus: Secondary | ICD-10-CM

## 2015-02-25 LAB — GLUCOSE, CAPILLARY: GLUCOSE-CAPILLARY: 92 mg/dL (ref 65–99)

## 2015-02-25 MED ORDER — FLUDEOXYGLUCOSE F - 18 (FDG) INJECTION
12.6800 | Freq: Once | INTRAVENOUS | Status: AC | PRN
  Administered 2015-02-25: 12.68 via INTRAVENOUS

## 2015-02-28 ENCOUNTER — Inpatient Hospital Stay: Payer: Medicare Other

## 2015-02-28 ENCOUNTER — Inpatient Hospital Stay (HOSPITAL_BASED_OUTPATIENT_CLINIC_OR_DEPARTMENT_OTHER): Payer: Medicare Other | Admitting: Family Medicine

## 2015-02-28 VITALS — BP 106/69 | HR 80 | Temp 96.9°F | Wt 153.4 lb

## 2015-02-28 DIAGNOSIS — R4701 Aphasia: Secondary | ICD-10-CM | POA: Insufficient documentation

## 2015-02-28 DIAGNOSIS — Z79899 Other long term (current) drug therapy: Secondary | ICD-10-CM | POA: Diagnosis not present

## 2015-02-28 DIAGNOSIS — C3492 Malignant neoplasm of unspecified part of left bronchus or lung: Secondary | ICD-10-CM | POA: Diagnosis not present

## 2015-02-28 DIAGNOSIS — C3432 Malignant neoplasm of lower lobe, left bronchus or lung: Secondary | ICD-10-CM

## 2015-02-28 DIAGNOSIS — G8190 Hemiplegia, unspecified affecting unspecified side: Secondary | ICD-10-CM | POA: Insufficient documentation

## 2015-02-28 DIAGNOSIS — R4702 Dysphasia: Secondary | ICD-10-CM | POA: Insufficient documentation

## 2015-02-28 DIAGNOSIS — R339 Retention of urine, unspecified: Secondary | ICD-10-CM | POA: Insufficient documentation

## 2015-02-28 DIAGNOSIS — Z5111 Encounter for antineoplastic chemotherapy: Secondary | ICD-10-CM | POA: Diagnosis not present

## 2015-02-28 LAB — COMPREHENSIVE METABOLIC PANEL
ALBUMIN: 3.5 g/dL (ref 3.5–5.0)
ALT: 20 U/L (ref 17–63)
ANION GAP: 5 (ref 5–15)
AST: 24 U/L (ref 15–41)
Alkaline Phosphatase: 81 U/L (ref 38–126)
BUN: 13 mg/dL (ref 6–20)
CALCIUM: 8.6 mg/dL — AB (ref 8.9–10.3)
CO2: 22 mmol/L (ref 22–32)
Chloride: 108 mmol/L (ref 101–111)
Creatinine, Ser: 0.8 mg/dL (ref 0.61–1.24)
GFR calc non Af Amer: >60 mL/min (ref >60)
Glucose, Bld: 133 mg/dL — ABNORMAL HIGH (ref 65–99)
POTASSIUM: 3.2 mmol/L — AB (ref 3.5–5.1)
SODIUM: 135 mmol/L (ref 135–145)
TOTAL PROTEIN: 8.1 g/dL (ref 6.5–8.1)
Total Bilirubin: 0.3 mg/dL (ref 0.3–1.2)

## 2015-02-28 LAB — CBC WITH DIFFERENTIAL/PLATELET
BASOS PCT: 0 %
Basophils Absolute: 0 10*3/uL (ref 0–0.1)
EOS ABS: 0 10*3/uL (ref 0–0.7)
Eosinophils Relative: 1 %
HCT: 24.8 % — ABNORMAL LOW (ref 40.0–52.0)
Hemoglobin: 8.2 g/dL — ABNORMAL LOW (ref 13.0–18.0)
LYMPHS ABS: 2.8 10*3/uL (ref 1.0–3.6)
Lymphocytes Relative: 38 %
MCH: 34.2 pg — AB (ref 26.0–34.0)
MCHC: 33.2 g/dL (ref 32.0–36.0)
MCV: 103.1 fL — ABNORMAL HIGH (ref 80.0–100.0)
Monocytes Absolute: 0.7 10*3/uL (ref 0.2–1.0)
Monocytes Relative: 10 %
Neutro Abs: 3.7 10*3/uL (ref 1.4–6.5)
Neutrophils Relative %: 51 %
PLATELETS: 159 10*3/uL (ref 150–440)
RBC: 2.4 MIL/uL — AB (ref 4.40–5.90)
RDW: 21.5 % — ABNORMAL HIGH (ref 11.5–14.5)
WBC: 7.3 10*3/uL (ref 3.8–10.6)

## 2015-02-28 MED ORDER — DIPHENHYDRAMINE HCL 50 MG/ML IJ SOLN
25.0000 mg | Freq: Once | INTRAMUSCULAR | Status: DC
Start: 2015-02-28 — End: 2015-02-28

## 2015-02-28 MED ORDER — PACLITAXEL CHEMO INJECTION 300 MG/50ML: 175.0000 mg/m2 | Freq: Once | INTRAVENOUS | Status: DC

## 2015-02-28 MED ORDER — HEPARIN SOD (PORK) LOCK FLUSH 100 UNIT/ML IV SOLN
500.0000 [IU] | Freq: Once | INTRAVENOUS | Status: AC | PRN
  Administered 2015-02-28: 500 [IU]
  Filled 2015-02-28: qty 5

## 2015-02-28 MED ORDER — SODIUM CHLORIDE 0.9 % IV SOLN: 492.5000 mg | Freq: Once | INTRAVENOUS | Status: DC

## 2015-02-28 MED ORDER — SODIUM CHLORIDE 0.9 % IV SOLN
Freq: Once | INTRAVENOUS | Status: DC
  Filled 2015-02-28: qty 4

## 2015-02-28 MED ORDER — SODIUM CHLORIDE 0.9 % IV SOLN
Freq: Once | INTRAVENOUS | Status: DC
  Filled 2015-02-28: qty 1000

## 2015-02-28 MED ORDER — FAMOTIDINE IN NACL 20-0.9 MG/50ML-% IV SOLN: 20.0000 mg | Freq: Once | INTRAVENOUS | Status: DC

## 2015-02-28 MED ORDER — SODIUM CHLORIDE 0.9 % IV SOLN
Freq: Once | INTRAVENOUS | Status: DC
  Filled 2015-02-28: qty 5

## 2015-02-28 NOTE — Progress Notes (Signed)
[place]  Telephone:(336) [phone] Fax:(336) [phone]  ID: CLEO VILLAMIZAR OB: 1946-05-08  MR#: [id number]  EZM#:[id number]  Patient Care Team: Marden Noble, MD as PCP - General (Internal Medicine)  CHIEF COMPLAINT:  Chief Complaint  Patient presents with  . Follow-up    Lung    INTERVAL HISTORY: Patient is here for further evaluation and consideration of cycle 4 of carboplatinum and Taxol. The entire history is given by his brother secondary to patient's expressive aphasia. He was recently admitted to the hospital with fever and pneumonia, but now is fully recovered. He currently feels well. He has no recent fevers. He has no chest pain. He denies any shortness of breath, cough, or hemoptysis. He has a fair appetite, but denies any nausea, vomiting, cons patient, or diarrhea. He has no urinary complaints. Patient offers no specific complaints today.   REVIEW OF SYSTEMS:   Review of Systems  Constitutional: Positive for malaise/fatigue.  Respiratory: Negative.   Cardiovascular: Negative.   Neurological: Positive for weakness.    As per HPI. Otherwise, a complete review of systems is negatve.  PAST MEDICAL HISTORY: Past Medical History  Diagnosis Date  . Lung cancer   . Stroke   . Seizures   . Expressive aphasia     PAST SURGICAL HISTORY: Past Surgical History  Procedure Laterality Date  . Partial colectomy      FAMILY HISTORY Family History  Problem Relation Age of Onset  . Diabetes Mother        ADVANCED DIRECTIVES:    HEALTH MAINTENANCE: History  Substance Use Topics  . Smoking status: Former Research scientist (life sciences)  . Smokeless tobacco: Never Used  . Alcohol Use: No     Colonoscopy:  PAP:  Bone density:  Lipid panel:  Allergies  Allergen Reactions  . No Known Allergies     Current Outpatient Prescriptions  Medication Sig Dispense Refill  . acetaminophen (TYLENOL) 325 MG tablet Take 650 mg by mouth every 4 (four) hours as needed.    .  Cholecalciferol (VITAMIN D3) 50000 UNITS CAPS Take 1 capsule by mouth once a week.     . clopidogrel (PLAVIX) 75 MG tablet Take 75 mg by mouth daily.    . ferrous sulfate 325 (65 FE) MG tablet Take 1 tablet (325 mg total) by mouth 2 (two) times daily with a meal. 60 tablet 0  . folic acid (FOLVITE) 1 MG tablet Take 1 mg by mouth daily.    Marland Kitchen lactulose, encephalopathy, (CHRONULAC) 10 GM/15ML SOLN Take 10 g by mouth daily.    Marland Kitchen levETIRAcetam (KEPPRA) 500 MG tablet Take 500 mg by mouth 2 (two) times daily.    . megestrol (MEGACE) 40 MG/ML suspension Take 400 mg by mouth 2 (two) times daily.    . phenytoin (DILANTIN) 100 MG ER capsule Take 300 mg by mouth daily.     . potassium chloride SA (K-DUR,KLOR-CON) 20 MEQ tablet Take 40 mEq by mouth 2 (two) times daily.    . prochlorperazine (COMPAZINE) 10 MG tablet Take 10 mg by mouth every 6 (six) hours as needed for nausea or vomiting.    . senna-docusate (SENOKOT-S) 8.6-50 MG per tablet Take 1 tablet by mouth 2 (two) times daily.    . Skin Protectants, Misc. (EUCERIN) cream Apply 1 application topically 2 (two) times daily as needed for dry skin.      No current facility-administered medications for this visit.    OBJECTIVE: Filed Vitals:   02/28/15 0908  BP:  106/69  Pulse: 80  Temp: 96.9 F (36.1 C)     Body mass index is 20.81 kg/(m^2).    ECOG FS:1 - Symptomatic but completely ambulatory  General: Well-developed, well-nourished, no acute distress. In wheelchair. Eyes: anicteric sclera. Lungs: Clear to auscultation bilaterally. Heart: Regular rate and rhythm. No rubs, murmurs, or gallops. Abdomen: Soft, nontender, nondistended. No organomegaly noted, normoactive bowel sounds. Musculoskeletal: No edema, cyanosis, or clubbing. Neuro: Expressive Aphasia. Cranial nerves grossly intact. Skin: No rashes or petechiae noted. Psych: Normal affect.    LAB RESULTS:  Lab Results  Component Value Date   NA 135 02/28/2015   K 3.2* 02/28/2015    CL 108 02/28/2015   CO2 22 02/28/2015   GLUCOSE 133* 02/28/2015   BUN 13 02/28/2015   CREATININE 0.80 02/28/2015   CALCIUM 8.6* 02/28/2015   PROT 8.1 02/28/2015   ALBUMIN 3.5 02/28/2015   AST 24 02/28/2015   ALT 20 02/28/2015   ALKPHOS 81 02/28/2015   BILITOT 0.3 02/28/2015   GFRNONAA >60 02/28/2015   GFRAA >60 02/28/2015    Lab Results  Component Value Date   WBC 7.3 02/28/2015   NEUTROABS 3.7 02/28/2015   HGB 8.2* 02/28/2015   HCT 24.8* 02/28/2015   MCV 103.1* 02/28/2015   PLT 159 02/28/2015     STUDIES: Nm Pet Image Restag (ps) Skull Base To Thigh  02/25/2015   CLINICAL DATA:  Subsequent treatment strategy for lung cancer.  EXAM: NUCLEAR MEDICINE PET SKULL BASE TO THIGH  TECHNIQUE: 12.68 mCi F-18 FDG was injected intravenously. Full-ring PET imaging was performed from the skull base to thigh after the radiotracer. CT data was obtained and used for attenuation correction and anatomic localization.  FASTING BLOOD GLUCOSE:  Value: 92 mg/dl  COMPARISON:  PET-CT 10/19/2014 and CT scan 07/26/2014  FINDINGS: NECK  There is a cystic lesion in the right thyroid lobe measuring a maximum of 21 mm. There is moderate FDG uptake in both thyroid lobes which may suggest thyroiditis.  CHEST  Stable emphysematous changes and pulmonary scarring most notable in the left lung apex. Significant improved appearance of the left lower lobe lung mass and left hilar lymphadenopathy. There is lingular atelectasis but no measurable mass is identified. In the superior segment of the left lower lobe there is an enlarging pulmonary nodule measuring a maximum of 11 mm on image number 87. This is hypermetabolic with SUV max of 2.35. There is also a small adjacent left hilar lymph node which is metabolically active with SUV max of 4.0. No other pulmonary lesions are identified.  ABDOMEN/PELVIS  Area of hypermetabolism near the pancreatic head is resolved. I do not see a pancreatic head mass or enlarged hypermetabolic  lymph nodes. Stable surgical changes involving the abdomen and probable endovascular coils.  SKELETON  No focal hypermetabolic activity to suggest skeletal metastasis.  IMPRESSION: 1. Marked improvement since the prior PET-CT. The lingular mass has resolved. There is residual atelectasis but no hypermetabolism. 2. New/enlarging superior segment left lower lobe pulmonary nodule which is hypermetabolic and concerning for synchronous neoplasm. There is also a metabolically active left hilar lymph node. Recommend biopsy. 3. No findings for abdominal/pelvic metastatic disease. No pancreatic head mass or area of hypermetabolism is identified. 4. No findings for osseous metastatic disease.   Electronically Signed   By: Marijo Sanes M.D.   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  PLAN:    1. Lung cancer:  PET results reviewed independently as well as with Dr. Oliva Bustard, decision was made to hold treatment today as PET scan appears to have progressive disease. Patient has completed 4 of 6 planned cycles of carboplatin and Taxol. According to PET scan pancreatic mass has resolved however there is new left lower lobe pulmonary nodule which is hypermetabolic and concerning for synchronous neoplasm as well as active left hilar lymph nodes 2. Pancreatic mass: PET and biopsy confirm metastatic lesion. Chemotherapy and PET as above. 3. Hypokalemia: Continue oral supplementation as prescribed. 4. Reduced appetite: Continue Megace as prescribed.   Patient expressed understanding and was in agreement with this plan. He also understands that He can call clinic at any time with any questions, concerns, or complaints.   Dr. Oliva Bustard was available for consultation and review of plan of care for this patient.  Evlyn Kanner, NP   02/28/2015 3:32 PM     [place]  Telephone:(336) [phone]  Fax:(336) [phone]     DAERON CARRENO DOB:  March 16, 1946  MR#: [id number]  AOZ#:[id number]  Patient Care Team: Marden Noble, MD as PCP - General (Internal Medicine)  CHIEF COMPLAINT:  Chief Complaint  Patient presents with  . Follow-up    Lung   Patient had a recent PET scan on 02/25/2015.  INTERVAL HISTORY:  Patient is here for further evaluation and consideration of cycle 5 carboplatin and Taxol. He had a PET scan for restaging following cycle 4 of Carbo and Taxol. PET scan reveals some improvement overall but with new enlarging superior segment of the left lower lobe pulmonary nodule which is hypermetabolic and concerning for synchronous neoplasm. There is also a metabolically active left hilar lymph node which is new according to reports. Entire history is given by his brother secondary to patient's expressive aphasia.  REVIEW OF SYSTEMS:   Review of Systems  Constitutional: Positive for malaise/fatigue. Negative for fever, chills, weight loss and diaphoresis.  HENT: Negative for congestion, ear discharge, ear pain, hearing loss, nosebleeds, sore throat and tinnitus.   Eyes: Negative for blurred vision, double vision, photophobia, pain, discharge and redness.  Respiratory: Negative for cough, hemoptysis, sputum production, shortness of breath, wheezing and stridor.   Cardiovascular: Negative for chest pain, palpitations, orthopnea, claudication, leg swelling and PND.  Gastrointestinal: Negative for heartburn, nausea, vomiting, abdominal pain, diarrhea, constipation, blood in stool and melena.  Genitourinary: Negative.   Musculoskeletal: Negative.   Skin: Negative.   Neurological: Positive for focal weakness. Negative for dizziness, tingling, seizures, weakness and headaches.       Expressive aphasia  Endo/Heme/Allergies: Does not bruise/bleed easily.  Psychiatric/Behavioral: Negative for depression. The patient is not nervous/anxious and does not have insomnia.     As per HPI. Otherwise, a complete review of systems is  negatve.  ONCOLOGY HISTORY:  No history exists.    PAST MEDICAL HISTORY: Past Medical History  Diagnosis Date  . Lung cancer   . Stroke   . Seizures   . Expressive aphasia     PAST SURGICAL HISTORY: Past Surgical History  Procedure Laterality Date  . Partial colectomy      FAMILY HISTORY Family History  Problem Relation Age of Onset  . Diabetes Mother     GYNECOLOGIC HISTORY:  No LMP for male patient.     ADVANCED DIRECTIVES:    HEALTH MAINTENANCE: History  Substance Use Topics  . Smoking status: Former Research scientist (life sciences)  . Smokeless tobacco: Never Used  . Alcohol Use: No  Colonoscopy:  PAP:  Bone density:  Lipid panel:  Allergies  Allergen Reactions  . No Known Allergies     Current Outpatient Prescriptions  Medication Sig Dispense Refill  . acetaminophen (TYLENOL) 325 MG tablet Take 650 mg by mouth every 4 (four) hours as needed.    . Cholecalciferol (VITAMIN D3) 50000 UNITS CAPS Take 1 capsule by mouth once a week.     . clopidogrel (PLAVIX) 75 MG tablet Take 75 mg by mouth daily.    . ferrous sulfate 325 (65 FE) MG tablet Take 1 tablet (325 mg total) by mouth 2 (two) times daily with a meal. 60 tablet 0  . folic acid (FOLVITE) 1 MG tablet Take 1 mg by mouth daily.    Marland Kitchen lactulose, encephalopathy, (CHRONULAC) 10 GM/15ML SOLN Take 10 g by mouth daily.    Marland Kitchen levETIRAcetam (KEPPRA) 500 MG tablet Take 500 mg by mouth 2 (two) times daily.    . megestrol (MEGACE) 40 MG/ML suspension Take 400 mg by mouth 2 (two) times daily.    . phenytoin (DILANTIN) 100 MG ER capsule Take 300 mg by mouth daily.     . potassium chloride SA (K-DUR,KLOR-CON) 20 MEQ tablet Take 40 mEq by mouth 2 (two) times daily.    . prochlorperazine (COMPAZINE) 10 MG tablet Take 10 mg by mouth every 6 (six) hours as needed for nausea or vomiting.    . senna-docusate (SENOKOT-S) 8.6-50 MG per tablet Take 1 tablet by mouth 2 (two) times daily.    . Skin Protectants, Misc. (EUCERIN) cream Apply 1  application topically 2 (two) times daily as needed for dry skin.      No current facility-administered medications for this visit.   Facility-Administered Medications Ordered in Other Visits  Medication Dose Route Frequency Provider Last Rate Last Dose  . 0.9 %  sodium chloride infusion   Intravenous Once Lloyd Huger, MD      . CARBOplatin (PARAPLATIN) 490 mg in sodium chloride 0.9 % 250 mL chemo infusion  490 mg Intravenous Once Lloyd Huger, MD      . diphenhydrAMINE (BENADRYL) injection 25 mg  25 mg Intravenous Once Lloyd Huger, MD      . famotidine (PEPCID) IVPB 20 mg premix  20 mg Intravenous Once Lloyd Huger, MD      . fosaprepitant (EMEND) 150 mg, dexamethasone (DECADRON) 12 mg in sodium chloride 0.9 % 145 mL IVPB   Intravenous Once Lloyd Huger, MD      . heparin lock flush 100 unit/mL  500 Units Intracatheter Once PRN Lloyd Huger, MD      . ondansetron (ZOFRAN) 8 mg in sodium chloride 0.9 % 50 mL IVPB   Intravenous Once Lloyd Huger, MD      . PACLitaxel (TAXOL) 336 mg in dextrose 5 % 500 mL chemo infusion (> '80mg'$ /m2)  175 mg/m2 (Treatment Plan Actual) Intravenous Once Lloyd Huger, MD        OBJECTIVE: BP 106/69 mmHg  Pulse 80  Temp(Src) 96.9 F (36.1 C) (Tympanic)  Wt 153 lb 7 oz (69.599 kg)   Body mass index is 20.81 kg/(m^2).    ECOG FS:2 - Symptomatic, <50% confined to bed  General: Well-developed, well-nourished, no acute distress. Patient in wheelchair Eyes: Pink conjunctiva, anicteric sclera. HEENT: Normocephalic, moist mucous membranes, clear oropharnyx. Lungs: Clear to auscultation bilaterally. Heart: Regular rate and rhythm. No rubs, murmurs, or gallops. Abdomen: Soft, nontender, nondistended. No organomegaly noted, normoactive bowel sounds.  Musculoskeletal: No edema, cyanosis, or clubbing. Neuro: Expressive aphasia.. Cranial nerves grossly intact. Skin: No rashes or petechiae noted. Psych: Normal affect.  Expressive aphasia.   LAB RESULTS:     Component Value Date/Time   NA 135 02/28/2015 0841   NA 135 12/27/2014 0850   K 3.2* 02/28/2015 0841   K 4.9 12/27/2014 0850   CL 108 02/28/2015 0841   CL 106 12/27/2014 0850   CO2 22 02/28/2015 0841   CO2 25 12/27/2014 0850   GLUCOSE 133* 02/28/2015 0841   GLUCOSE 118* 12/27/2014 0850   BUN 13 02/28/2015 0841   BUN 11 12/27/2014 0850   CREATININE 0.80 02/28/2015 0841   CREATININE 0.89 12/27/2014 0850   CALCIUM 8.6* 02/28/2015 0841   CALCIUM 9.1 12/27/2014 0850   PROT 8.1 02/28/2015 0841   PROT 8.9* 12/27/2014 0850   ALBUMIN 3.5 02/28/2015 0841   ALBUMIN 3.8 12/27/2014 0850   AST 24 02/28/2015 0841   AST 27 12/27/2014 0850   ALT 20 02/28/2015 0841   ALT 20 12/27/2014 0850   ALKPHOS 81 02/28/2015 0841   ALKPHOS 129* 12/27/2014 0850   BILITOT 0.3 02/28/2015 0841   GFRNONAA >60 02/28/2015 0841   GFRNONAA >60 12/27/2014 0850   GFRAA >60 02/28/2015 0841   GFRAA >60 12/27/2014 0850    No results found for: SPEP, UPEP  Lab Results  Component Value Date   WBC 7.3 02/28/2015   NEUTROABS 3.7 02/28/2015   HGB 8.2* 02/28/2015   HCT 24.8* 02/28/2015   MCV 103.1* 02/28/2015   PLT 159 02/28/2015      Chemistry      Component Value Date/Time   NA 135 02/28/2015 0841   NA 135 12/27/2014 0850   K 3.2* 02/28/2015 0841   K 4.9 12/27/2014 0850   CL 108 02/28/2015 0841   CL 106 12/27/2014 0850   CO2 22 02/28/2015 0841   CO2 25 12/27/2014 0850   BUN 13 02/28/2015 0841   BUN 11 12/27/2014 0850   CREATININE 0.80 02/28/2015 0841   CREATININE 0.89 12/27/2014 0850      Component Value Date/Time   CALCIUM 8.6* 02/28/2015 0841   CALCIUM 9.1 12/27/2014 0850   ALKPHOS 81 02/28/2015 0841   ALKPHOS 129* 12/27/2014 0850   AST 24 02/28/2015 0841   AST 27 12/27/2014 0850   ALT 20 02/28/2015 0841   ALT 20 12/27/2014 0850   BILITOT 0.3 02/28/2015 0841       No results found for: LABCA2  No components found for: [id number]  No results  for input(s): INR in the last 168 hours.     Component Value Date/Time   COLORURINE YELLOW* 01/23/2015 2145   APPEARANCEUR CLEAR* 01/23/2015 2145   LABSPEC 1.021 01/23/2015 2145   PHURINE 5.0 01/23/2015 2145   GLUCOSEU NEGATIVE 01/23/2015 2145   HGBUR 1+* 01/23/2015 2145   BILIRUBINUR NEGATIVE 01/23/2015 2145   KETONESUR NEGATIVE 01/23/2015 2145   PROTEINUR NEGATIVE 01/23/2015 2145   NITRITE POSITIVE* 01/23/2015 2145   LEUKOCYTESUR TRACE* 01/23/2015 2145    STUDIES: Nm Pet Image Restag (ps) Skull Base To Thigh  02/25/2015   CLINICAL DATA:  Subsequent treatment strategy for lung cancer.  EXAM: NUCLEAR MEDICINE PET SKULL BASE TO THIGH  TECHNIQUE: 12.68 mCi F-18 FDG was injected intravenously. Full-ring PET imaging was performed from the skull base to thigh after the radiotracer. CT data was obtained and used for attenuation correction and anatomic localization.  FASTING BLOOD GLUCOSE:  Value: 92 mg/dl  COMPARISON:  PET-CT  10/19/2014 and CT scan 07/26/2014  FINDINGS: NECK  There is a cystic lesion in the right thyroid lobe measuring a maximum of 21 mm. There is moderate FDG uptake in both thyroid lobes which may suggest thyroiditis.  CHEST  Stable emphysematous changes and pulmonary scarring most notable in the left lung apex. Significant improved appearance of the left lower lobe lung mass and left hilar lymphadenopathy. There is lingular atelectasis but no measurable mass is identified. In the superior segment of the left lower lobe there is an enlarging pulmonary nodule measuring a maximum of 11 mm on image number 87. This is hypermetabolic with SUV max of 1.15. There is also a small adjacent left hilar lymph node which is metabolically active with SUV max of 4.0. No other pulmonary lesions are identified.  ABDOMEN/PELVIS  Area of hypermetabolism near the pancreatic head is resolved. I do not see a pancreatic head mass or enlarged hypermetabolic lymph nodes. Stable surgical changes involving the  abdomen and probable endovascular coils.  SKELETON  No focal hypermetabolic activity to suggest skeletal metastasis.  IMPRESSION: 1. Marked improvement since the prior PET-CT. The lingular mass has resolved. There is residual atelectasis but no hypermetabolism. 2. New/enlarging superior segment left lower lobe pulmonary nodule which is hypermetabolic and concerning for synchronous neoplasm. There is also a metabolically active left hilar lymph node. Recommend biopsy. 3. No findings for abdominal/pelvic metastatic disease. No pancreatic head mass or area of hypermetabolism is identified. 4. No findings for osseous metastatic disease.   Electronically Signed   By: Marijo Sanes M.D.   On: 02/25/2015 12:54    ASSESSMENT:  Stage IV non small cell lung cancer  PLAN:   1. Lung cancer. PET results reviewed independently as well as with Dr. Oliva Bustard, decision was made to hold treatment today as PET scan appears to have progressive disease. Patient has completed 4 of 6 planned cycles of carboplatin and Taxol. According to PET scan pancreatic mass has resolved however there is new left lower lobe pulmonary nodule which is hypermetabolic and concerning for synchronous neoplasm as well as active left hilar lymph nodes 2. Pancreatic mass. PET and biopsy confirm metastatic lesion. Chemotherapy and PET as above. 3. Hypokalemia. Continue oral supplementation as prescribed. 4. Reduced appetite. Continue Megace as prescribed.  We'll have patient follow-up with Dr. Grayland Ormond in 1 week to discuss continued plans for treatment.  Brother expressed understanding and was in agreement with this plan. He also understands that He can call clinic at any time with any questions, concerns, or complaints.   Dr. Oliva Bustard was available for consultation and review of plan of care for this patient.   Patient expressed understanding and was in agreement with this plan. He also understands that He can call clinic at any time with any  questions, concerns, or complaints.    Lung cancer   Staging form: Lung, AJCC 6th Edition     Clinical stage from 01/24/2015: Stage IV (T3, N2, M1) - Signed by Lloyd Huger, MD on 01/24/2015   Evlyn Kanner, NP   02/28/2015 9:39 AM

## 2015-03-07 ENCOUNTER — Other Ambulatory Visit: Payer: Self-pay | Admitting: Family Medicine

## 2015-03-07 ENCOUNTER — Inpatient Hospital Stay (HOSPITAL_BASED_OUTPATIENT_CLINIC_OR_DEPARTMENT_OTHER): Payer: Medicare Other | Admitting: Oncology

## 2015-03-07 ENCOUNTER — Inpatient Hospital Stay: Payer: Medicare Other

## 2015-03-07 VITALS — BP 101/67 | HR 79 | Temp 97.2°F | Resp 17 | Ht 70.0 in | Wt 157.6 lb

## 2015-03-07 DIAGNOSIS — Z418 Encounter for other procedures for purposes other than remedying health state: Secondary | ICD-10-CM | POA: Diagnosis not present

## 2015-03-07 DIAGNOSIS — E876 Hypokalemia: Secondary | ICD-10-CM

## 2015-03-07 DIAGNOSIS — C7889 Secondary malignant neoplasm of other digestive organs: Secondary | ICD-10-CM | POA: Diagnosis not present

## 2015-03-07 DIAGNOSIS — Z79899 Other long term (current) drug therapy: Secondary | ICD-10-CM

## 2015-03-07 DIAGNOSIS — C3490 Malignant neoplasm of unspecified part of unspecified bronchus or lung: Secondary | ICD-10-CM | POA: Diagnosis not present

## 2015-03-07 DIAGNOSIS — C3432 Malignant neoplasm of lower lobe, left bronchus or lung: Secondary | ICD-10-CM

## 2015-03-07 DIAGNOSIS — Z5111 Encounter for antineoplastic chemotherapy: Secondary | ICD-10-CM | POA: Diagnosis not present

## 2015-03-07 DIAGNOSIS — C3492 Malignant neoplasm of unspecified part of left bronchus or lung: Secondary | ICD-10-CM

## 2015-03-07 LAB — COMPREHENSIVE METABOLIC PANEL
ALBUMIN: 3.7 g/dL (ref 3.5–5.0)
ALT: 16 U/L — ABNORMAL LOW (ref 17–63)
AST: 23 U/L (ref 15–41)
Alkaline Phosphatase: 90 U/L (ref 38–126)
Anion gap: 6 (ref 5–15)
BILIRUBIN TOTAL: 0.4 mg/dL (ref 0.3–1.2)
BUN: 13 mg/dL (ref 6–20)
CHLORIDE: 107 mmol/L (ref 101–111)
CO2: 23 mmol/L (ref 22–32)
Calcium: 8.6 mg/dL — ABNORMAL LOW (ref 8.9–10.3)
Creatinine, Ser: 0.84 mg/dL (ref 0.61–1.24)
GFR calc Af Amer: >60 mL/min (ref >60)
GLUCOSE: 132 mg/dL — AB (ref 65–99)
Potassium: 3.5 mmol/L (ref 3.5–5.1)
Sodium: 136 mmol/L (ref 135–145)
Total Protein: 8 g/dL (ref 6.5–8.1)

## 2015-03-07 LAB — CBC WITH DIFFERENTIAL/PLATELET
BASOS PCT: 1 %
Basophils Absolute: 0.1 10*3/uL (ref 0–0.1)
EOS ABS: 0 10*3/uL (ref 0–0.7)
Eosinophils Relative: 1 %
HCT: 28.3 % — ABNORMAL LOW (ref 40.0–52.0)
Hemoglobin: 9.3 g/dL — ABNORMAL LOW (ref 13.0–18.0)
LYMPHS ABS: 2.7 10*3/uL (ref 1.0–3.6)
Lymphocytes Relative: 43 %
MCH: 34.7 pg — ABNORMAL HIGH (ref 26.0–34.0)
MCHC: 32.9 g/dL (ref 32.0–36.0)
MCV: 105.4 fL — ABNORMAL HIGH (ref 80.0–100.0)
Monocytes Absolute: 0.8 10*3/uL (ref 0.2–1.0)
Monocytes Relative: 13 %
NEUTROS PCT: 42 %
Neutro Abs: 2.6 10*3/uL (ref 1.4–6.5)
PLATELETS: 273 10*3/uL (ref 150–440)
RBC: 2.69 MIL/uL — ABNORMAL LOW (ref 4.40–5.90)
RDW: 21.1 % — AB (ref 11.5–14.5)
WBC: 6.2 10*3/uL (ref 3.8–10.6)

## 2015-03-18 NOTE — Progress Notes (Signed)
Brian Khan  Telephone:(336) [phone] Fax:(336) [phone]  ID: Brian Khan OB: June 09, 1946  MR#: [id number]  PJK#:[id number]  Patient Care Team: Marden Noble, MD as PCP - General (Internal Medicine)  CHIEF COMPLAINT:  Chief Complaint  Patient presents with  . Follow-up    Lung Cancer, and results    INTERVAL HISTORY: Patient is here for further evaluation and discussion of his imaging results. The entire history is given by his brother secondary to patient's expressive aphasia. He currently is at his baseline. He currently feels well. He has no recent fevers. He has no chest pain. He denies any shortness of breath, cough, or hemoptysis. He has a fair appetite, but denies any nausea, vomiting, cons patient, or diarrhea. He has no urinary complaints. Patient offers no specific complaints today.   REVIEW OF SYSTEMS:   Review of Systems  Constitutional: Positive for malaise/fatigue.  Respiratory: Negative.   Cardiovascular: Negative.   Neurological: Positive for weakness.    As per HPI. Otherwise, a complete review of systems is negatve.  PAST MEDICAL HISTORY: Past Medical History  Diagnosis Date  . Lung cancer   . Stroke   . Seizures   . Expressive aphasia     PAST SURGICAL HISTORY: Past Surgical History  Procedure Laterality Date  . Partial colectomy      FAMILY HISTORY Family History  Problem Relation Age of Onset  . Diabetes Mother        ADVANCED DIRECTIVES:    HEALTH MAINTENANCE: History  Substance Use Topics  . Smoking status: Former Research scientist (life sciences)  . Smokeless tobacco: Never Used  . Alcohol Use: No     Colonoscopy:  PAP:  Bone density:  Lipid panel:  Allergies  Allergen Reactions  . No Known Allergies     Current Outpatient Prescriptions  Medication Sig Dispense Refill  . acetaminophen (TYLENOL) 325 MG tablet Take 650 mg by mouth every 4 (four) hours as needed.    . Cholecalciferol (VITAMIN D3) 50000 UNITS CAPS Take 1  capsule by mouth once a week.     . clopidogrel (PLAVIX) 75 MG tablet Take 75 mg by mouth daily.    . ferrous sulfate 325 (65 FE) MG tablet Take 1 tablet (325 mg total) by mouth 2 (two) times daily with a meal. 60 tablet 0  . folic acid (FOLVITE) 1 MG tablet Take 1 mg by mouth daily.    Marland Kitchen lactulose, encephalopathy, (CHRONULAC) 10 GM/15ML SOLN Take 10 g by mouth daily.    Marland Kitchen levETIRAcetam (KEPPRA) 500 MG tablet Take 500 mg by mouth 2 (two) times daily.    . megestrol (MEGACE) 40 MG/ML suspension Take 400 mg by mouth 2 (two) times daily.    . mirtazapine (REMERON) 7.5 MG tablet     . omeprazole (PRILOSEC) 20 MG capsule     . phenytoin (DILANTIN) 100 MG ER capsule Take 300 mg by mouth daily.     . potassium chloride SA (K-DUR,KLOR-CON) 20 MEQ tablet Take 40 mEq by mouth 2 (two) times daily.    . prochlorperazine (COMPAZINE) 10 MG tablet Take 10 mg by mouth every 6 (six) hours as needed for nausea or vomiting.    . senna-docusate (SENOKOT-S) 8.6-50 MG per tablet Take 1 tablet by mouth 2 (two) times daily.    . Skin Protectants, Misc. (EUCERIN) cream Apply 1 application topically 2 (two) times daily as needed for dry skin.      No current facility-administered medications for this visit.  OBJECTIVE: Filed Vitals:   03/07/15 0906  BP: 101/67  Pulse: 79  Temp: 97.2 F (36.2 C)  Resp: 17     Body mass index is 22.62 kg/(m^2).    ECOG FS:1 - Symptomatic but completely ambulatory  General: Well-developed, well-nourished, no acute distress. Eyes: anicteric sclera. Lungs: Clear to auscultation bilaterally. Heart: Regular rate and rhythm. No rubs, murmurs, or gallops. Abdomen: Soft, nontender, nondistended. No organomegaly noted, normoactive bowel sounds. Musculoskeletal: No edema, cyanosis, or clubbing. Neuro: Alert, answering all questions appropriately. Cranial nerves grossly intact. Skin: No rashes or petechiae noted. Psych: Normal affect.    LAB RESULTS:  Lab Results  Component  Value Date   NA 136 03/07/2015   K 3.5 03/07/2015   CL 107 03/07/2015   CO2 23 03/07/2015   GLUCOSE 132* 03/07/2015   BUN 13 03/07/2015   CREATININE 0.84 03/07/2015   CALCIUM 8.6* 03/07/2015   PROT 8.0 03/07/2015   ALBUMIN 3.7 03/07/2015   AST 23 03/07/2015   ALT 16* 03/07/2015   ALKPHOS 90 03/07/2015   BILITOT 0.4 03/07/2015   GFRNONAA >60 03/07/2015   GFRAA >60 03/07/2015    Lab Results  Component Value Date   WBC 6.2 03/07/2015   NEUTROABS 2.6 03/07/2015   HGB 9.3* 03/07/2015   HCT 28.3* 03/07/2015   MCV 105.4* 03/07/2015   PLT 273 03/07/2015     STUDIES: Nm Pet Image Restag (ps) Skull Base To Thigh  02/25/2015   CLINICAL DATA:  Subsequent treatment strategy for lung cancer.  EXAM: NUCLEAR MEDICINE PET SKULL BASE TO THIGH  TECHNIQUE: 12.68 mCi F-18 FDG was injected intravenously. Full-ring PET imaging was performed from the skull base to thigh after the radiotracer. CT data was obtained and used for attenuation correction and anatomic localization.  FASTING BLOOD GLUCOSE:  Value: 92 mg/dl  COMPARISON:  PET-CT 10/19/2014 and CT scan 07/26/2014  FINDINGS: NECK  There is a cystic lesion in the right thyroid lobe measuring a maximum of 21 mm. There is moderate FDG uptake in both thyroid lobes which may suggest thyroiditis.  CHEST  Stable emphysematous changes and pulmonary scarring most notable in the left lung apex. Significant improved appearance of the left lower lobe lung mass and left hilar lymphadenopathy. There is lingular atelectasis but no measurable mass is identified. In the superior segment of the left lower lobe there is an enlarging pulmonary nodule measuring a maximum of 11 mm on image number 87. This is hypermetabolic with SUV max of 3.32. There is also a small adjacent left hilar lymph node which is metabolically active with SUV max of 4.0. No other pulmonary lesions are identified.  ABDOMEN/PELVIS  Area of hypermetabolism near the pancreatic head is resolved. I do not  see a pancreatic head mass or enlarged hypermetabolic lymph nodes. Stable surgical changes involving the abdomen and probable endovascular coils.  SKELETON  No focal hypermetabolic activity to suggest skeletal metastasis.  IMPRESSION: 1. Marked improvement since the prior PET-CT. The lingular mass has resolved. There is residual atelectasis but no hypermetabolism. 2. New/enlarging superior segment left lower lobe pulmonary nodule which is hypermetabolic and concerning for synchronous neoplasm. There is also a metabolically active left hilar lymph node. Recommend biopsy. 3. No findings for abdominal/pelvic metastatic disease. No pancreatic head mass or area of hypermetabolism is identified. 4. No findings for osseous metastatic disease.   Electronically Signed   By: Marijo Sanes M.D.   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  PLAN:    1. Lung cancer: PET results reviewed independently as well as discussed at cancer conference. Patient has overall improvement of disease and the new 8 mm focus is likely inflammatory or infectious. Return to clinic in 1 week for continuation of chemotherapy which will be cycle 5 of 6 of carboplatinum and Taxol. Will restage again at the conclusion of cycle 6. 2. Pancreatic mass: PET and biopsy confirm metastatic lesion. Chemotherapy and PET as above. 3. Hypokalemia: Continue oral supplementation as prescribed. 4. Reduced appetite: Continue Megace as prescribed.   Patient expressed understanding and was in agreement with this plan. He also understands that He can call clinic at any time with any questions, concerns, or complaints.   No matching staging information was found for the patient.  Lloyd Huger, MD   03/18/2015 8:18 AM

## 2015-03-21 ENCOUNTER — Inpatient Hospital Stay (HOSPITAL_BASED_OUTPATIENT_CLINIC_OR_DEPARTMENT_OTHER): Payer: Medicare Other | Admitting: Oncology

## 2015-03-21 ENCOUNTER — Inpatient Hospital Stay: Payer: Medicare Other | Attending: Oncology

## 2015-03-21 ENCOUNTER — Inpatient Hospital Stay: Payer: Medicare Other

## 2015-03-21 VITALS — BP 110/75 | HR 79 | Temp 97.3°F | Wt 145.7 lb

## 2015-03-21 DIAGNOSIS — Z8673 Personal history of transient ischemic attack (TIA), and cerebral infarction without residual deficits: Secondary | ICD-10-CM | POA: Diagnosis not present

## 2015-03-21 DIAGNOSIS — E876 Hypokalemia: Secondary | ICD-10-CM | POA: Diagnosis not present

## 2015-03-21 DIAGNOSIS — Z418 Encounter for other procedures for purposes other than remedying health state: Secondary | ICD-10-CM | POA: Insufficient documentation

## 2015-03-21 DIAGNOSIS — C3490 Malignant neoplasm of unspecified part of unspecified bronchus or lung: Secondary | ICD-10-CM

## 2015-03-21 DIAGNOSIS — C7889 Secondary malignant neoplasm of other digestive organs: Secondary | ICD-10-CM | POA: Insufficient documentation

## 2015-03-21 DIAGNOSIS — C3432 Malignant neoplasm of lower lobe, left bronchus or lung: Secondary | ICD-10-CM

## 2015-03-21 DIAGNOSIS — C7989 Secondary malignant neoplasm of other specified sites: Secondary | ICD-10-CM

## 2015-03-21 DIAGNOSIS — Z87891 Personal history of nicotine dependence: Secondary | ICD-10-CM | POA: Diagnosis not present

## 2015-03-21 DIAGNOSIS — Z5111 Encounter for antineoplastic chemotherapy: Secondary | ICD-10-CM | POA: Insufficient documentation

## 2015-03-21 DIAGNOSIS — Z79899 Other long term (current) drug therapy: Secondary | ICD-10-CM | POA: Diagnosis not present

## 2015-03-21 DIAGNOSIS — C3492 Malignant neoplasm of unspecified part of left bronchus or lung: Secondary | ICD-10-CM

## 2015-03-21 LAB — COMPREHENSIVE METABOLIC PANEL
ALBUMIN: 3.9 g/dL (ref 3.5–5.0)
ALT: 15 U/L — ABNORMAL LOW (ref 17–63)
AST: 25 U/L (ref 15–41)
Alkaline Phosphatase: 92 U/L (ref 38–126)
Anion gap: 7 (ref 5–15)
BUN: 12 mg/dL (ref 6–20)
CO2: 22 mmol/L (ref 22–32)
CREATININE: 0.97 mg/dL (ref 0.61–1.24)
Calcium: 8.6 mg/dL — ABNORMAL LOW (ref 8.9–10.3)
Chloride: 105 mmol/L (ref 101–111)
Glucose, Bld: 108 mg/dL — ABNORMAL HIGH (ref 65–99)
Potassium: 4.6 mmol/L (ref 3.5–5.1)
SODIUM: 134 mmol/L — AB (ref 135–145)
Total Bilirubin: 0.6 mg/dL (ref 0.3–1.2)
Total Protein: 8.3 g/dL — ABNORMAL HIGH (ref 6.5–8.1)

## 2015-03-21 LAB — CBC WITH DIFFERENTIAL/PLATELET
Basophils Absolute: 0.1 10*3/uL (ref 0–0.1)
Basophils Relative: 1 %
Eosinophils Absolute: 0.1 10*3/uL (ref 0–0.7)
Eosinophils Relative: 1 %
HCT: 29.8 % — ABNORMAL LOW (ref 40.0–52.0)
Hemoglobin: 10 g/dL — ABNORMAL LOW (ref 13.0–18.0)
LYMPHS PCT: 54 %
Lymphs Abs: 3.7 10*3/uL — ABNORMAL HIGH (ref 1.0–3.6)
MCH: 35.4 pg — ABNORMAL HIGH (ref 26.0–34.0)
MCHC: 33.4 g/dL (ref 32.0–36.0)
MCV: 105.9 fL — ABNORMAL HIGH (ref 80.0–100.0)
MONO ABS: 0.8 10*3/uL (ref 0.2–1.0)
Monocytes Relative: 11 %
NEUTROS PCT: 33 %
Neutro Abs: 2.3 10*3/uL (ref 1.4–6.5)
Platelets: 186 10*3/uL (ref 150–440)
RBC: 2.82 MIL/uL — ABNORMAL LOW (ref 4.40–5.90)
RDW: 17.7 % — AB (ref 11.5–14.5)
WBC: 6.9 10*3/uL (ref 3.8–10.6)

## 2015-03-21 MED ORDER — SODIUM CHLORIDE 0.9 % IJ SOLN
10.0000 mL | INTRAMUSCULAR | Status: DC | PRN
  Administered 2015-03-21: 10 mL via INTRAVENOUS
  Filled 2015-03-21: qty 10

## 2015-03-21 MED ORDER — SODIUM CHLORIDE 0.9 % IV SOLN
Freq: Once | INTRAVENOUS | Status: AC
  Administered 2015-03-21: 10:00:00 via INTRAVENOUS
  Filled 2015-03-21: qty 1000

## 2015-03-21 MED ORDER — HEPARIN SOD (PORK) LOCK FLUSH 100 UNIT/ML IV SOLN
500.0000 [IU] | Freq: Once | INTRAVENOUS | Status: DC | PRN
  Filled 2015-03-21: qty 5

## 2015-03-21 MED ORDER — PACLITAXEL CHEMO INJECTION 300 MG/50ML
336.0000 mg | Freq: Once | INTRAVENOUS | Status: AC
  Administered 2015-03-21: 336 mg via INTRAVENOUS
  Filled 2015-03-21: qty 56

## 2015-03-21 MED ORDER — SODIUM CHLORIDE 0.9 % IV SOLN
Freq: Once | INTRAVENOUS | Status: AC
  Administered 2015-03-21: 11:00:00 via INTRAVENOUS
  Filled 2015-03-21: qty 8

## 2015-03-21 MED ORDER — HEPARIN SOD (PORK) LOCK FLUSH 100 UNIT/ML IV SOLN
500.0000 [IU] | Freq: Once | INTRAVENOUS | Status: AC
  Administered 2015-03-21: 500 [IU] via INTRAVENOUS
  Filled 2015-03-21: qty 5

## 2015-03-21 MED ORDER — DIPHENHYDRAMINE HCL 50 MG/ML IJ SOLN
25.0000 mg | Freq: Once | INTRAMUSCULAR | Status: AC
  Administered 2015-03-21: 25 mg via INTRAVENOUS
  Filled 2015-03-21: qty 1

## 2015-03-21 MED ORDER — SODIUM CHLORIDE 0.9 % IV SOLN
490.0000 mg | Freq: Once | INTRAVENOUS | Status: AC
  Administered 2015-03-21: 490 mg via INTRAVENOUS
  Filled 2015-03-21: qty 49

## 2015-03-21 MED ORDER — FAMOTIDINE IN NACL 20-0.9 MG/50ML-% IV SOLN
20.0000 mg | Freq: Once | INTRAVENOUS | Status: AC
  Administered 2015-03-21: 20 mg via INTRAVENOUS
  Filled 2015-03-21: qty 50

## 2015-03-22 ENCOUNTER — Inpatient Hospital Stay: Payer: Medicare Other

## 2015-03-22 VITALS — BP 101/68 | HR 80 | Resp 20

## 2015-03-22 DIAGNOSIS — C3490 Malignant neoplasm of unspecified part of unspecified bronchus or lung: Secondary | ICD-10-CM

## 2015-03-22 DIAGNOSIS — C7989 Secondary malignant neoplasm of other specified sites: Principal | ICD-10-CM

## 2015-03-22 DIAGNOSIS — Z5111 Encounter for antineoplastic chemotherapy: Secondary | ICD-10-CM | POA: Diagnosis not present

## 2015-03-22 MED ORDER — PEGFILGRASTIM INJECTION 6 MG/0.6ML ~~LOC~~
6.0000 mg | PREFILLED_SYRINGE | Freq: Once | SUBCUTANEOUS | Status: AC
  Administered 2015-03-22: 6 mg via SUBCUTANEOUS
  Filled 2015-03-22: qty 0.6

## 2015-04-05 NOTE — Progress Notes (Signed)
[place]  Telephone:(336) [phone] Fax:(336) [phone]  ID: Brian Khan OB: [birth year]  MR#: [id number]  JSH#:[id number]  Patient Care Team: Marden Noble, MD as PCP - General (Internal Medicine)  CHIEF COMPLAINT:  Chief Complaint  Patient presents with  . Follow-up  . Chemotherapy  . Lung Cancer    INTERVAL HISTORY: Patient is here for further evaluation and consideration of cycle 5 of 6 of carboplatinum and Taxol. The entire history is given by his brother secondary to patient's expressive aphasia. He currently is at his baseline. He currently feels well. He has no recent fevers. He has no chest pain. He denies any shortness of breath, cough, or hemoptysis. He has a fair appetite, but denies any nausea, vomiting, cons patient, or diarrhea. He has no urinary complaints. Patient offers no specific complaints today.   REVIEW OF SYSTEMS:   Review of Systems  Constitutional: Positive for malaise/fatigue.  Respiratory: Negative.   Cardiovascular: Negative.   Neurological: Positive for weakness.    As per HPI. Otherwise, a complete review of systems is negatve.  PAST MEDICAL HISTORY: Past Medical History  Diagnosis Date  . Lung cancer   . Stroke   . Seizures   . Expressive aphasia     PAST SURGICAL HISTORY: Past Surgical History  Procedure Laterality Date  . Partial colectomy      FAMILY HISTORY Family History  Problem Relation Age of Onset  . Diabetes Mother        ADVANCED DIRECTIVES:    HEALTH MAINTENANCE: History  Substance Use Topics  . Smoking status: Former Research scientist (life sciences)  . Smokeless tobacco: Never Used  . Alcohol Use: No     Colonoscopy:  PAP:  Bone density:  Lipid panel:  Allergies  Allergen Reactions  . No Known Allergies     Current Outpatient Prescriptions  Medication Sig Dispense Refill  . acetaminophen (TYLENOL) 325 MG tablet Take 650 mg by mouth every 4 (four) hours as needed.    . Cholecalciferol (VITAMIN D3)  50000 UNITS CAPS Take 1 capsule by mouth once a week.     . clopidogrel (PLAVIX) 75 MG tablet Take 75 mg by mouth daily.    . ferrous sulfate 325 (65 FE) MG tablet Take 1 tablet (325 mg total) by mouth 2 (two) times daily with a meal. 60 tablet 0  . folic acid (FOLVITE) 1 MG tablet Take 1 mg by mouth daily.    Marland Kitchen lactulose, encephalopathy, (CHRONULAC) 10 GM/15ML SOLN Take 10 g by mouth daily.    Marland Kitchen levETIRAcetam (KEPPRA) 500 MG tablet Take 500 mg by mouth 2 (two) times daily.    . mirtazapine (REMERON) 7.5 MG tablet     . morphine (ROXANOL) 20 MG/ML concentrated solution Take by mouth every 2 (two) hours as needed for severe pain. Give 0.47m sublinguially for SOB or pain    . omeprazole (PRILOSEC) 20 MG capsule     . phenytoin (DILANTIN) 100 MG ER capsule Take 300 mg by mouth daily.     . potassium chloride SA (K-DUR,KLOR-CON) 20 MEQ tablet Take 40 mEq by mouth 2 (two) times daily.    . prochlorperazine (COMPAZINE) 10 MG tablet Take 10 mg by mouth every 6 (six) hours as needed for nausea or vomiting.    . senna-docusate (SENOKOT-S) 8.6-50 MG per tablet Take 1 tablet by mouth 2 (two) times daily.    . Skin Protectants, Misc. (EUCERIN) cream Apply 1 application topically 2 (two) times daily as needed for  dry skin.     . megestrol (MEGACE) 40 MG/ML suspension Take 400 mg by mouth 2 (two) times daily.     No current facility-administered medications for this visit.    OBJECTIVE: Filed Vitals:   03/21/15 0947  BP: 110/75  Pulse: 79  Temp: 97.3 F (36.3 C)     Body mass index is 20.91 kg/(m^2).    ECOG FS:1 - Symptomatic but completely ambulatory  General: Well-developed, well-nourished, no acute distress. Eyes: anicteric sclera. Lungs: Clear to auscultation bilaterally. Heart: Regular rate and rhythm. No rubs, murmurs, or gallops. Abdomen: Soft, nontender, nondistended. No organomegaly noted, normoactive bowel sounds. Musculoskeletal: No edema, cyanosis, or clubbing. Neuro: Alert,  answering all questions appropriately. Cranial nerves grossly intact. Skin: No rashes or petechiae noted. Psych: Normal affect.    LAB RESULTS:  Lab Results  Component Value Date   NA 134* 03/21/2015   K 4.6 03/21/2015   CL 105 03/21/2015   CO2 22 03/21/2015   GLUCOSE 108* 03/21/2015   BUN 12 03/21/2015   CREATININE 0.97 03/21/2015   CALCIUM 8.6* 03/21/2015   PROT 8.3* 03/21/2015   ALBUMIN 3.9 03/21/2015   AST 25 03/21/2015   ALT 15* 03/21/2015   ALKPHOS 92 03/21/2015   BILITOT 0.6 03/21/2015   GFRNONAA >60 03/21/2015   GFRAA >60 03/21/2015    Lab Results  Component Value Date   WBC 6.9 03/21/2015   NEUTROABS 2.3 03/21/2015   HGB 10.0* 03/21/2015   HCT 29.8* 03/21/2015   MCV 105.9* 03/21/2015   PLT 186 03/21/2015     STUDIES: No results found.  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  PLAN:    1. Lung cancer: PET results reviewed independently as well as discussed at cancer conference. Patient has overall improvement of disease and the new 8 mm focus is likely inflammatory or infectious. Proceed with cycle 5 of 6 of carboplatinum and Taxol. Return to clinic tomorrow for Neulasta and then in 3 weeks for consideration of cycle 6.  Will restage again at the conclusion of cycle 6. 2. Pancreatic mass: PET and biopsy confirm metastatic lesion. Chemotherapy and PET as above. 3. Hypokalemia: Potassium is now within normal limits. Continue oral supplementation as prescribed. 4. Reduced appetite: Continue Megace as prescribed.   Patient expressed understanding and was in agreement with this plan. He also understands that He can call clinic at any time with any questions, concerns, or complaints.   No matching staging information was found for the patient.  Lloyd Huger, MD   04/05/2015 11:53 AM

## 2015-04-11 ENCOUNTER — Inpatient Hospital Stay (HOSPITAL_BASED_OUTPATIENT_CLINIC_OR_DEPARTMENT_OTHER): Payer: Medicare Other | Admitting: Oncology

## 2015-04-11 ENCOUNTER — Inpatient Hospital Stay: Payer: Medicare Other

## 2015-04-11 ENCOUNTER — Inpatient Hospital Stay: Payer: Medicare Other | Attending: Oncology

## 2015-04-11 VITALS — BP 105/71 | HR 82 | Temp 95.6°F | Resp 18 | Ht 69.0 in

## 2015-04-11 DIAGNOSIS — C3490 Malignant neoplasm of unspecified part of unspecified bronchus or lung: Secondary | ICD-10-CM | POA: Insufficient documentation

## 2015-04-11 DIAGNOSIS — K047 Periapical abscess without sinus: Secondary | ICD-10-CM

## 2015-04-11 DIAGNOSIS — Z87891 Personal history of nicotine dependence: Secondary | ICD-10-CM | POA: Insufficient documentation

## 2015-04-11 DIAGNOSIS — Z8673 Personal history of transient ischemic attack (TIA), and cerebral infarction without residual deficits: Secondary | ICD-10-CM | POA: Diagnosis not present

## 2015-04-11 DIAGNOSIS — Z79899 Other long term (current) drug therapy: Secondary | ICD-10-CM | POA: Insufficient documentation

## 2015-04-11 DIAGNOSIS — C7889 Secondary malignant neoplasm of other digestive organs: Secondary | ICD-10-CM | POA: Diagnosis not present

## 2015-04-11 DIAGNOSIS — E876 Hypokalemia: Secondary | ICD-10-CM | POA: Insufficient documentation

## 2015-04-11 DIAGNOSIS — C3432 Malignant neoplasm of lower lobe, left bronchus or lung: Secondary | ICD-10-CM

## 2015-04-11 DIAGNOSIS — Z5111 Encounter for antineoplastic chemotherapy: Secondary | ICD-10-CM | POA: Insufficient documentation

## 2015-04-11 DIAGNOSIS — C3492 Malignant neoplasm of unspecified part of left bronchus or lung: Secondary | ICD-10-CM

## 2015-04-11 DIAGNOSIS — Z418 Encounter for other procedures for purposes other than remedying health state: Secondary | ICD-10-CM | POA: Diagnosis not present

## 2015-04-11 LAB — COMPREHENSIVE METABOLIC PANEL
ALT: 20 U/L (ref 17–63)
AST: 25 U/L (ref 15–41)
Albumin: 3.5 g/dL (ref 3.5–5.0)
Alkaline Phosphatase: 94 U/L (ref 38–126)
Anion gap: 5 (ref 5–15)
BUN: 8 mg/dL (ref 6–20)
CO2: 24 mmol/L (ref 22–32)
CREATININE: 0.93 mg/dL (ref 0.61–1.24)
Calcium: 8.3 mg/dL — ABNORMAL LOW (ref 8.9–10.3)
Chloride: 105 mmol/L (ref 101–111)
GFR calc Af Amer: >60 mL/min (ref >60)
GLUCOSE: 102 mg/dL — AB (ref 65–99)
POTASSIUM: 3.5 mmol/L (ref 3.5–5.1)
Sodium: 134 mmol/L — ABNORMAL LOW (ref 135–145)
Total Bilirubin: 0.3 mg/dL (ref 0.3–1.2)
Total Protein: 8 g/dL (ref 6.5–8.1)

## 2015-04-11 LAB — CBC WITH DIFFERENTIAL/PLATELET
Basophils Absolute: 0.1 10*3/uL (ref 0–0.1)
Basophils Relative: 1 %
EOS ABS: 0 10*3/uL (ref 0–0.7)
Eosinophils Relative: 1 %
HCT: 26.2 % — ABNORMAL LOW (ref 40.0–52.0)
Hemoglobin: 8.8 g/dL — ABNORMAL LOW (ref 13.0–18.0)
LYMPHS PCT: 44 %
Lymphs Abs: 2.8 10*3/uL (ref 1.0–3.6)
MCH: 36.1 pg — ABNORMAL HIGH (ref 26.0–34.0)
MCHC: 33.7 g/dL (ref 32.0–36.0)
MCV: 107.2 fL — ABNORMAL HIGH (ref 80.0–100.0)
Monocytes Absolute: 0.7 10*3/uL (ref 0.2–1.0)
Monocytes Relative: 11 %
Neutro Abs: 2.7 10*3/uL (ref 1.4–6.5)
Neutrophils Relative %: 43 %
Platelets: 142 10*3/uL — ABNORMAL LOW (ref 150–440)
RBC: 2.45 MIL/uL — ABNORMAL LOW (ref 4.40–5.90)
RDW: 14.4 % (ref 11.5–14.5)
WBC: 6.3 10*3/uL (ref 3.8–10.6)

## 2015-04-11 MED ORDER — HEPARIN SOD (PORK) LOCK FLUSH 100 UNIT/ML IV SOLN
500.0000 [IU] | Freq: Once | INTRAVENOUS | Status: AC
Start: 2015-04-11 — End: 2015-04-11
  Administered 2015-04-11: 500 [IU] via INTRAVENOUS
  Filled 2015-04-11: qty 5

## 2015-04-11 MED ORDER — SODIUM CHLORIDE 0.9 % IJ SOLN
10.0000 mL | INTRAMUSCULAR | Status: AC | PRN
  Administered 2015-04-11: 10 mL via INTRAVENOUS
  Filled 2015-04-11: qty 10

## 2015-04-16 NOTE — Progress Notes (Signed)
[place]  Telephone:(336) [phone] Fax:(336) [phone]  ID: Brian Khan OB: 09-25-1945  MR#: [id number]  UTM#:[id number]  Patient Care Team: Marden Noble, MD as PCP - General (Internal Medicine)  CHIEF COMPLAINT:  Chief Complaint  Patient presents with  . Follow-up    Lung    INTERVAL HISTORY: Patient is here for further evaluation and consideration of cycle 6 of carboplatinum and Taxol. The entire history is given by his brother secondary to patient's expressive aphasia. He recently was found to have an abscessed tooth and is currently on anti-biotics. He feels more lethargic. He has no recent fevers. He has no chest pain. He denies any shortness of breath, cough, or hemoptysis. He has a fair appetite, but denies any nausea, vomiting, cons patient, or diarrhea. He has no urinary complaints. Patient offers no further specific complaints today.   REVIEW OF SYSTEMS:   Review of Systems  Constitutional: Positive for malaise/fatigue.  Respiratory: Negative.   Cardiovascular: Negative.   Neurological: Positive for weakness.    As per HPI. Otherwise, a complete review of systems is negatve.  PAST MEDICAL HISTORY: Past Medical History  Diagnosis Date  . Lung cancer   . Stroke   . Seizures   . Expressive aphasia     PAST SURGICAL HISTORY: Past Surgical History  Procedure Laterality Date  . Partial colectomy      FAMILY HISTORY Family History  Problem Relation Age of Onset  . Diabetes Mother        ADVANCED DIRECTIVES:    HEALTH MAINTENANCE: History  Substance Use Topics  . Smoking status: Former Research scientist (life sciences)  . Smokeless tobacco: Never Used  . Alcohol Use: No     Colonoscopy:  PAP:  Bone density:  Lipid panel:  Allergies  Allergen Reactions  . No Known Allergies     Current Outpatient Prescriptions  Medication Sig Dispense Refill  . acetaminophen (TYLENOL) 325 MG tablet Take 650 mg by mouth every 4 (four) hours as needed.    Marland Kitchen  amoxicillin (AMOXIL) 500 MG capsule     . Cholecalciferol (VITAMIN D3) 50000 UNITS CAPS Take 1 capsule by mouth once a week.     . clopidogrel (PLAVIX) 75 MG tablet Take 75 mg by mouth daily.    . ferrous sulfate 325 (65 FE) MG tablet Take 1 tablet (325 mg total) by mouth 2 (two) times daily with a meal. 60 tablet 0  . folic acid (FOLVITE) 1 MG tablet Take 1 mg by mouth daily.    Marland Kitchen lactulose, encephalopathy, (CHRONULAC) 10 GM/15ML SOLN Take 10 g by mouth daily.    Marland Kitchen levETIRAcetam (KEPPRA) 500 MG tablet Take 500 mg by mouth 2 (two) times daily.    . megestrol (MEGACE) 40 MG/ML suspension Take 400 mg by mouth 2 (two) times daily.    . mirtazapine (REMERON) 7.5 MG tablet     . morphine (ROXANOL) 20 MG/ML concentrated solution Take by mouth every 2 (two) hours as needed for severe pain. Give 0.41m sublinguially for SOB or pain    . nystatin (MYCOSTATIN) 100000 UNIT/ML suspension     . omeprazole (PRILOSEC) 20 MG capsule     . phenytoin (DILANTIN) 100 MG ER capsule Take 300 mg by mouth daily.     . potassium chloride SA (K-DUR,KLOR-CON) 20 MEQ tablet Take 40 mEq by mouth 2 (two) times daily.    . prochlorperazine (COMPAZINE) 10 MG tablet Take 10 mg by mouth every 6 (six) hours as needed for  nausea or vomiting.    . senna-docusate (SENOKOT-S) 8.6-50 MG per tablet Take 1 tablet by mouth 2 (two) times daily.    . Skin Protectants, Misc. (EUCERIN) cream Apply 1 application topically 2 (two) times daily as needed for dry skin.      No current facility-administered medications for this visit.   Facility-Administered Medications Ordered in Other Visits  Medication Dose Route Frequency Provider Last Rate Last Dose  . sodium chloride 0.9 % injection 10 mL  10 mL Intravenous PRN Lloyd Huger, MD   10 mL at 04/11/15 0912    OBJECTIVE: Filed Vitals:   04/11/15 0939  BP: 105/71  Pulse: 82  Temp: 95.6 F (35.3 C)  Resp: 18     There is no weight on file to calculate BMI.    ECOG FS:1 -  Symptomatic but completely ambulatory  General: Well-developed, well-nourished, no acute distress. Eyes: anicteric sclera. Lungs: Clear to auscultation bilaterally. Heart: Regular rate and rhythm. No rubs, murmurs, or gallops. Abdomen: Soft, nontender, nondistended. No organomegaly noted, normoactive bowel sounds. Musculoskeletal: No edema, cyanosis, or clubbing. Neuro: Alert, answering all questions appropriately. Cranial nerves grossly intact. Skin: No rashes or petechiae noted. Psych: Normal affect.    LAB RESULTS:  Lab Results  Component Value Date   NA 134* 04/11/2015   K 3.5 04/11/2015   CL 105 04/11/2015   CO2 24 04/11/2015   GLUCOSE 102* 04/11/2015   BUN 8 04/11/2015   CREATININE 0.93 04/11/2015   CALCIUM 8.3* 04/11/2015   PROT 8.0 04/11/2015   ALBUMIN 3.5 04/11/2015   AST 25 04/11/2015   ALT 20 04/11/2015   ALKPHOS 94 04/11/2015   BILITOT 0.3 04/11/2015   GFRNONAA >60 04/11/2015   GFRAA >60 04/11/2015    Lab Results  Component Value Date   WBC 6.3 04/11/2015   NEUTROABS 2.7 04/11/2015   HGB 8.8* 04/11/2015   HCT 26.2* 04/11/2015   MCV 107.2* 04/11/2015   PLT 142* 04/11/2015     STUDIES: No results found.  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  PLAN:    1. Lung cancer: PET results from February 25, 2015 reviewed independently as well as discussed at cancer conference. Patient has overall improvement of disease and the new 8 mm focus is likely inflammatory or infectious. Will delay cycle 6 of carboplatinum and Taxol secondary to abscess tooth. Return to clinic in 1 week for reconsideration of cycle 6.  Will restage again at the conclusion of cycle 6. 2. Pancreatic mass: PET and biopsy confirm metastatic lesion. Chemotherapy and PET as above. 3. Hypokalemia: Potassium is now within normal limits. Continue oral supplementation as prescribed. 4. Reduced appetite: Continue Megace as prescribed. 5. Abscessed tooth:  Continue antibodies as prescribed.   Patient expressed understanding and was in agreement with this plan. He also understands that He can call clinic at any time with any questions, concerns, or complaints.   No matching staging information was found for the patient.  Lloyd Huger, MD   04/16/2015 9:29 AM

## 2015-04-17 ENCOUNTER — Telehealth: Payer: Self-pay | Admitting: *Deleted

## 2015-04-17 NOTE — Telephone Encounter (Signed)
Brian Khan informed adn transferred to scheduler to change appt

## 2015-04-17 NOTE — Telephone Encounter (Signed)
Reports that pt has an appt for treatment tomorrow, but he is still on oral abx for an abcess and is asking if he needs to keep tomorrows appt

## 2015-04-17 NOTE — Telephone Encounter (Signed)
Per Dr Grayland Ormond, resched for next week

## 2015-04-18 ENCOUNTER — Inpatient Hospital Stay: Payer: Medicare Other

## 2015-04-18 ENCOUNTER — Inpatient Hospital Stay: Payer: Medicare Other | Admitting: Oncology

## 2015-04-24 ENCOUNTER — Inpatient Hospital Stay (HOSPITAL_BASED_OUTPATIENT_CLINIC_OR_DEPARTMENT_OTHER): Payer: Medicare Other | Admitting: Oncology

## 2015-04-24 ENCOUNTER — Inpatient Hospital Stay: Payer: Medicare Other

## 2015-04-24 VITALS — BP 107/71 | HR 66 | Temp 96.7°F | Resp 18

## 2015-04-24 DIAGNOSIS — E876 Hypokalemia: Secondary | ICD-10-CM

## 2015-04-24 DIAGNOSIS — C3490 Malignant neoplasm of unspecified part of unspecified bronchus or lung: Secondary | ICD-10-CM

## 2015-04-24 DIAGNOSIS — C3432 Malignant neoplasm of lower lobe, left bronchus or lung: Secondary | ICD-10-CM

## 2015-04-24 DIAGNOSIS — C7889 Secondary malignant neoplasm of other digestive organs: Secondary | ICD-10-CM | POA: Diagnosis not present

## 2015-04-24 DIAGNOSIS — Z79899 Other long term (current) drug therapy: Secondary | ICD-10-CM

## 2015-04-24 DIAGNOSIS — Z418 Encounter for other procedures for purposes other than remedying health state: Secondary | ICD-10-CM

## 2015-04-24 DIAGNOSIS — C7989 Secondary malignant neoplasm of other specified sites: Secondary | ICD-10-CM

## 2015-04-24 DIAGNOSIS — Z5111 Encounter for antineoplastic chemotherapy: Secondary | ICD-10-CM | POA: Diagnosis not present

## 2015-04-24 LAB — COMPREHENSIVE METABOLIC PANEL
ALT: 18 U/L (ref 17–63)
AST: 27 U/L (ref 15–41)
Albumin: 3.6 g/dL (ref 3.5–5.0)
Alkaline Phosphatase: 74 U/L (ref 38–126)
Anion gap: 5 (ref 5–15)
BILIRUBIN TOTAL: 0.3 mg/dL (ref 0.3–1.2)
BUN: 16 mg/dL (ref 6–20)
CHLORIDE: 107 mmol/L (ref 101–111)
CO2: 23 mmol/L (ref 22–32)
CREATININE: 0.89 mg/dL (ref 0.61–1.24)
Calcium: 8.5 mg/dL — ABNORMAL LOW (ref 8.9–10.3)
Glucose, Bld: 100 mg/dL — ABNORMAL HIGH (ref 65–99)
Potassium: 3.7 mmol/L (ref 3.5–5.1)
Sodium: 135 mmol/L (ref 135–145)
TOTAL PROTEIN: 7.8 g/dL (ref 6.5–8.1)

## 2015-04-24 LAB — CBC WITH DIFFERENTIAL/PLATELET
BASOS PCT: 1 %
Basophils Absolute: 0 10*3/uL (ref 0–0.1)
EOS ABS: 0.1 10*3/uL (ref 0–0.7)
Eosinophils Relative: 1 %
HEMATOCRIT: 26.7 % — AB (ref 40.0–52.0)
Hemoglobin: 9.1 g/dL — ABNORMAL LOW (ref 13.0–18.0)
Lymphocytes Relative: 46 %
Lymphs Abs: 3 10*3/uL (ref 1.0–3.6)
MCH: 35.6 pg — AB (ref 26.0–34.0)
MCHC: 34.2 g/dL (ref 32.0–36.0)
MCV: 104.1 fL — AB (ref 80.0–100.0)
Monocytes Absolute: 0.9 10*3/uL (ref 0.2–1.0)
Monocytes Relative: 14 %
NEUTROS PCT: 38 %
Neutro Abs: 2.5 10*3/uL (ref 1.4–6.5)
PLATELETS: 207 10*3/uL (ref 150–440)
RBC: 2.57 MIL/uL — ABNORMAL LOW (ref 4.40–5.90)
RDW: 14.4 % (ref 11.5–14.5)
WBC: 6.4 10*3/uL (ref 3.8–10.6)

## 2015-04-24 MED ORDER — DIPHENHYDRAMINE HCL 50 MG/ML IJ SOLN
25.0000 mg | Freq: Once | INTRAMUSCULAR | Status: AC
  Administered 2015-04-24: 25 mg via INTRAVENOUS
  Filled 2015-04-24: qty 1

## 2015-04-24 MED ORDER — HEPARIN SOD (PORK) LOCK FLUSH 100 UNIT/ML IV SOLN
500.0000 [IU] | Freq: Once | INTRAVENOUS | Status: AC
  Administered 2015-04-24: 500 [IU] via INTRAVENOUS

## 2015-04-24 MED ORDER — SODIUM CHLORIDE 0.9 % IJ SOLN
10.0000 mL | INTRAMUSCULAR | Status: DC | PRN
  Filled 2015-04-24: qty 10

## 2015-04-24 MED ORDER — SODIUM CHLORIDE 0.9 % IV SOLN
Freq: Once | INTRAVENOUS | Status: AC
  Administered 2015-04-24: 11:00:00 via INTRAVENOUS
  Filled 2015-04-24: qty 1000

## 2015-04-24 MED ORDER — HEPARIN SOD (PORK) LOCK FLUSH 100 UNIT/ML IV SOLN
INTRAVENOUS | Status: AC
  Filled 2015-04-24: qty 5

## 2015-04-24 MED ORDER — SODIUM CHLORIDE 0.9 % IV SOLN
336.0000 mg | Freq: Once | INTRAVENOUS | Status: AC
  Administered 2015-04-24: 336 mg via INTRAVENOUS
  Filled 2015-04-24: qty 56

## 2015-04-24 MED ORDER — SODIUM CHLORIDE 0.9 % IV SOLN
Freq: Once | INTRAVENOUS | Status: AC
  Administered 2015-04-24: 11:00:00 via INTRAVENOUS
  Filled 2015-04-24: qty 8

## 2015-04-24 MED ORDER — SODIUM CHLORIDE 0.9 % IV SOLN
467.5000 mg | Freq: Once | INTRAVENOUS | Status: AC
  Administered 2015-04-24: 470 mg via INTRAVENOUS
  Filled 2015-04-24: qty 47

## 2015-04-24 MED ORDER — FAMOTIDINE IN NACL 20-0.9 MG/50ML-% IV SOLN
20.0000 mg | Freq: Once | INTRAVENOUS | Status: AC
  Administered 2015-04-24: 20 mg via INTRAVENOUS
  Filled 2015-04-24: qty 50

## 2015-04-25 ENCOUNTER — Other Ambulatory Visit: Payer: Medicare Other

## 2015-04-26 ENCOUNTER — Inpatient Hospital Stay: Payer: Medicare Other

## 2015-04-26 DIAGNOSIS — Z5111 Encounter for antineoplastic chemotherapy: Secondary | ICD-10-CM | POA: Diagnosis not present

## 2015-04-26 DIAGNOSIS — C3490 Malignant neoplasm of unspecified part of unspecified bronchus or lung: Secondary | ICD-10-CM

## 2015-04-26 DIAGNOSIS — C7989 Secondary malignant neoplasm of other specified sites: Principal | ICD-10-CM

## 2015-04-26 MED ORDER — PEGFILGRASTIM INJECTION 6 MG/0.6ML ~~LOC~~
6.0000 mg | PREFILLED_SYRINGE | Freq: Once | SUBCUTANEOUS | Status: AC
  Administered 2015-04-26: 6 mg via SUBCUTANEOUS
  Filled 2015-04-26: qty 0.6

## 2015-04-26 NOTE — Progress Notes (Signed)
[place]  Telephone:(336) [phone] Fax:(336) [phone]  ID: Brian Khan OB: 02-01-1946  MR#: [id number]  JQB#:[id number]  Patient Care Team: Marden Noble, MD as PCP - General (Internal Medicine)  CHIEF COMPLAINT:  Chief Complaint  Patient presents with  . Follow-up    lung cancer    INTERVAL HISTORY: Patient is here for further evaluation and reconsideration of cycle 6 of carboplatinum and Taxol. His treatment has been delayed several weeks secondary to an abscess tooth which is now resolved. The entire history is given by his brother secondary to patient's expressive aphasia. He currently feels well and is back to his baseline.  He has no recent fevers. He has no chest pain. He denies any shortness of breath, cough, or hemoptysis. He has a fair appetite, but denies any nausea, vomiting, cons patient, or diarrhea. He has no urinary complaints. Patient offers no further specific complaints today.   REVIEW OF SYSTEMS:   Review of Systems  Constitutional: Positive for malaise/fatigue.  HENT: Negative.   Respiratory: Negative.   Cardiovascular: Negative.   Neurological: Positive for weakness.    As per HPI. Otherwise, a complete review of systems is negatve.  PAST MEDICAL HISTORY: Past Medical History  Diagnosis Date  . Lung cancer   . Stroke   . Seizures   . Expressive aphasia     PAST SURGICAL HISTORY: Past Surgical History  Procedure Laterality Date  . Partial colectomy      FAMILY HISTORY Family History  Problem Relation Age of Onset  . Diabetes Mother        ADVANCED DIRECTIVES:    HEALTH MAINTENANCE: Social History  Substance Use Topics  . Smoking status: Former Research scientist (life sciences)  . Smokeless tobacco: Never Used  . Alcohol Use: No     Colonoscopy:  PAP:  Bone density:  Lipid panel:  Allergies  Allergen Reactions  . No Known Allergies     Current Outpatient Prescriptions  Medication Sig Dispense Refill  . acetaminophen  (TYLENOL) 325 MG tablet Take 650 mg by mouth every 4 (four) hours as needed.    . Cholecalciferol (VITAMIN D3) 50000 UNITS CAPS Take 1 capsule by mouth once a week.     . clopidogrel (PLAVIX) 75 MG tablet Take 75 mg by mouth daily.    . ferrous sulfate 325 (65 FE) MG tablet Take 1 tablet (325 mg total) by mouth 2 (two) times daily with a meal. 60 tablet 0  . folic acid (FOLVITE) 1 MG tablet Take 1 mg by mouth daily.    Marland Kitchen lactulose, encephalopathy, (CHRONULAC) 10 GM/15ML SOLN Take 10 g by mouth daily.    Marland Kitchen levETIRAcetam (KEPPRA) 500 MG tablet Take 500 mg by mouth 2 (two) times daily.    . megestrol (MEGACE) 40 MG/ML suspension Take 400 mg by mouth 2 (two) times daily.    . mirtazapine (REMERON) 7.5 MG tablet     . morphine (ROXANOL) 20 MG/ML concentrated solution Take by mouth every 2 (two) hours as needed for severe pain. Give 0.3m sublinguially for SOB or pain    . nystatin (MYCOSTATIN) 100000 UNIT/ML suspension     . omeprazole (PRILOSEC) 20 MG capsule     . phenytoin (DILANTIN) 100 MG ER capsule Take 300 mg by mouth daily.     . potassium chloride SA (K-DUR,KLOR-CON) 20 MEQ tablet Take 40 mEq by mouth 2 (two) times daily.    . prochlorperazine (COMPAZINE) 10 MG tablet Take 10 mg by mouth every 6 (  six) hours as needed for nausea or vomiting.    . senna-docusate (SENOKOT-S) 8.6-50 MG per tablet Take 1 tablet by mouth 2 (two) times daily.    . Skin Protectants, Misc. (EUCERIN) cream Apply 1 application topically 2 (two) times daily as needed for dry skin.     Marland Kitchen amoxicillin (AMOXIL) 500 MG capsule      No current facility-administered medications for this visit.   Facility-Administered Medications Ordered in Other Visits  Medication Dose Route Frequency Provider Last Rate Last Dose  . sodium chloride 0.9 % injection 10 mL  10 mL Intravenous PRN Lloyd Huger, MD   10 mL at 04/11/15 0912    OBJECTIVE: Filed Vitals:   04/24/15 0936  BP: 107/71  Pulse: 66  Temp: 96.7 F (35.9 C)    Resp: 18     There is no weight on file to calculate BMI.    ECOG FS:1 - Symptomatic but completely ambulatory  General: Well-developed, well-nourished, no acute distress. Eyes: anicteric sclera. Lungs: Clear to auscultation bilaterally. Heart: Regular rate and rhythm. No rubs, murmurs, or gallops. Abdomen: Soft, nontender, nondistended. No organomegaly noted, normoactive bowel sounds. Musculoskeletal: No edema, cyanosis, or clubbing. Neuro: Alert, answering all questions appropriately. Cranial nerves grossly intact. Skin: No rashes or petechiae noted. Psych: Normal affect.    LAB RESULTS:  Lab Results  Component Value Date   NA 135 04/24/2015   K 3.7 04/24/2015   CL 107 04/24/2015   CO2 23 04/24/2015   GLUCOSE 100* 04/24/2015   BUN 16 04/24/2015   CREATININE 0.89 04/24/2015   CALCIUM 8.5* 04/24/2015   PROT 7.8 04/24/2015   ALBUMIN 3.6 04/24/2015   AST 27 04/24/2015   ALT 18 04/24/2015   ALKPHOS 74 04/24/2015   BILITOT 0.3 04/24/2015   GFRNONAA >60 04/24/2015   GFRAA >60 04/24/2015    Lab Results  Component Value Date   WBC 6.4 04/24/2015   NEUTROABS 2.5 04/24/2015   HGB 9.1* 04/24/2015   HCT 26.7* 04/24/2015   MCV 104.1* 04/24/2015   PLT 207 04/24/2015     STUDIES: No results found.  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  PLAN:    1. Lung cancer: PET results from February 25, 2015 reviewed independently as well as discussed at cancer conference. Patient has overall improvement of disease and the new 8 mm focus is likely inflammatory or infectious. Proceed with cycle 6 of carboplatinum and Taxol today. Return to clinic in 3 weeks for consideration of cycle 7. Will reimage with PET scan 1-2 days prior to next treatment. 2. Pancreatic mass: PET and biopsy confirm metastatic lesion. Chemotherapy and PET as above. 3. Hypokalemia: Potassium is now within normal limits. Continue oral supplementation as prescribed. 4.  Reduced appetite: Continue Megace as prescribed. 5. Abscessed tooth: Resolved  Patient expressed understanding and was in agreement with this plan. He also understands that He can call clinic at any time with any questions, concerns, or complaints.   No matching staging information was found for the patient.  Lloyd Huger, MD   04/26/2015 2:51 PM

## 2015-05-09 ENCOUNTER — Ambulatory Visit
Admission: RE | Admit: 2015-05-09 | Discharge: 2015-05-09 | Disposition: A | Discharge status: Home / Self Care | Payer: Medicare Other | Source: Ambulatory Visit | Attending: Oncology | Admitting: Oncology

## 2015-05-09 DIAGNOSIS — C3432 Malignant neoplasm of lower lobe, left bronchus or lung: Secondary | ICD-10-CM | POA: Insufficient documentation

## 2015-05-09 LAB — GLUCOSE, CAPILLARY: Glucose-Capillary: 77 mg/dL (ref 65–99)

## 2015-05-09 MED ORDER — FLUDEOXYGLUCOSE F - 18 (FDG) INJECTION
12.4500 | Freq: Once | INTRAVENOUS | Status: DC | PRN
  Administered 2015-05-09: 12.45 via INTRAVENOUS
  Filled 2015-05-09: qty 12.45

## 2015-05-15 ENCOUNTER — Inpatient Hospital Stay: Payer: Medicare Other

## 2015-05-15 ENCOUNTER — Inpatient Hospital Stay: Payer: Medicare Other | Admitting: Oncology

## 2015-05-23 ENCOUNTER — Inpatient Hospital Stay: Payer: Medicare Other | Attending: Oncology

## 2015-05-23 ENCOUNTER — Inpatient Hospital Stay (HOSPITAL_BASED_OUTPATIENT_CLINIC_OR_DEPARTMENT_OTHER): Payer: Medicare Other | Admitting: Oncology

## 2015-05-23 ENCOUNTER — Inpatient Hospital Stay: Payer: Medicare Other

## 2015-05-23 VITALS — BP 102/70 | HR 93 | Temp 97.7°F | Resp 20

## 2015-05-23 DIAGNOSIS — C7989 Secondary malignant neoplasm of other specified sites: Secondary | ICD-10-CM

## 2015-05-23 DIAGNOSIS — Z5111 Encounter for antineoplastic chemotherapy: Secondary | ICD-10-CM | POA: Diagnosis present

## 2015-05-23 DIAGNOSIS — Z79899 Other long term (current) drug therapy: Secondary | ICD-10-CM | POA: Insufficient documentation

## 2015-05-23 DIAGNOSIS — C3490 Malignant neoplasm of unspecified part of unspecified bronchus or lung: Secondary | ICD-10-CM

## 2015-05-23 DIAGNOSIS — C7889 Secondary malignant neoplasm of other digestive organs: Secondary | ICD-10-CM | POA: Insufficient documentation

## 2015-05-23 DIAGNOSIS — C3492 Malignant neoplasm of unspecified part of left bronchus or lung: Secondary | ICD-10-CM

## 2015-05-23 DIAGNOSIS — C3432 Malignant neoplasm of lower lobe, left bronchus or lung: Secondary | ICD-10-CM

## 2015-05-23 DIAGNOSIS — Z87891 Personal history of nicotine dependence: Secondary | ICD-10-CM | POA: Insufficient documentation

## 2015-05-23 DIAGNOSIS — Z8673 Personal history of transient ischemic attack (TIA), and cerebral infarction without residual deficits: Secondary | ICD-10-CM | POA: Diagnosis not present

## 2015-05-23 LAB — CBC WITH DIFFERENTIAL/PLATELET
BASOS ABS: 0.1 10*3/uL (ref 0–0.1)
BASOS PCT: 1 %
EOS ABS: 0.1 10*3/uL (ref 0–0.7)
EOS PCT: 1 %
HCT: 27.7 % — ABNORMAL LOW (ref 40.0–52.0)
HEMOGLOBIN: 9.3 g/dL — AB (ref 13.0–18.0)
Lymphocytes Relative: 31 %
Lymphs Abs: 2.8 10*3/uL (ref 1.0–3.6)
MCH: 35.3 pg — ABNORMAL HIGH (ref 26.0–34.0)
MCHC: 33.7 g/dL (ref 32.0–36.0)
MCV: 104.7 fL — ABNORMAL HIGH (ref 80.0–100.0)
Monocytes Absolute: 1.1 10*3/uL — ABNORMAL HIGH (ref 0.2–1.0)
Monocytes Relative: 13 %
NEUTROS PCT: 54 %
Neutro Abs: 4.8 10*3/uL (ref 1.4–6.5)
PLATELETS: 208 10*3/uL (ref 150–440)
RBC: 2.65 MIL/uL — AB (ref 4.40–5.90)
RDW: 16.2 % — ABNORMAL HIGH (ref 11.5–14.5)
WBC: 8.9 10*3/uL (ref 3.8–10.6)

## 2015-05-23 LAB — COMPREHENSIVE METABOLIC PANEL
ALBUMIN: 3.9 g/dL (ref 3.5–5.0)
ALK PHOS: 90 U/L (ref 38–126)
ALT: 10 U/L — AB (ref 17–63)
ANION GAP: 5 (ref 5–15)
AST: 25 U/L (ref 15–41)
BUN: 12 mg/dL (ref 6–20)
CALCIUM: 8.9 mg/dL (ref 8.9–10.3)
CHLORIDE: 110 mmol/L (ref 101–111)
CO2: 22 mmol/L (ref 22–32)
CREATININE: 0.89 mg/dL (ref 0.61–1.24)
GFR calc Af Amer: >60 mL/min (ref >60)
GFR calc non Af Amer: >60 mL/min (ref >60)
GLUCOSE: 123 mg/dL — AB (ref 65–99)
Potassium: 3.5 mmol/L (ref 3.5–5.1)
SODIUM: 137 mmol/L (ref 135–145)
Total Bilirubin: 0.5 mg/dL (ref 0.3–1.2)
Total Protein: 8.1 g/dL (ref 6.5–8.1)

## 2015-05-23 MED ORDER — SODIUM CHLORIDE 0.9 % IV SOLN
Freq: Once | INTRAVENOUS | Status: AC
  Administered 2015-05-23: 10:00:00 via INTRAVENOUS
  Filled 2015-05-23: qty 8

## 2015-05-23 MED ORDER — CARBOPLATIN CHEMO INJECTION 600 MG/60ML
467.5000 mg | Freq: Once | INTRAVENOUS | Status: AC
  Administered 2015-05-23: 470 mg via INTRAVENOUS
  Filled 2015-05-23: qty 47

## 2015-05-23 MED ORDER — FAMOTIDINE IN NACL 20-0.9 MG/50ML-% IV SOLN
20.0000 mg | Freq: Once | INTRAVENOUS | Status: AC
  Administered 2015-05-23: 20 mg via INTRAVENOUS
  Filled 2015-05-23: qty 50

## 2015-05-23 MED ORDER — SODIUM CHLORIDE 0.9 % IV SOLN
Freq: Once | INTRAVENOUS | Status: AC
  Administered 2015-05-23: 10:00:00 via INTRAVENOUS
  Filled 2015-05-23: qty 1000

## 2015-05-23 MED ORDER — HEPARIN SOD (PORK) LOCK FLUSH 100 UNIT/ML IV SOLN
500.0000 [IU] | Freq: Once | INTRAVENOUS | Status: AC | PRN
  Administered 2015-05-23: 500 [IU]
  Filled 2015-05-23: qty 5

## 2015-05-23 MED ORDER — SODIUM CHLORIDE 0.9 % IV SOLN
336.0000 mg | Freq: Once | INTRAVENOUS | Status: AC
  Administered 2015-05-23: 336 mg via INTRAVENOUS
  Filled 2015-05-23: qty 56

## 2015-05-23 MED ORDER — DIPHENHYDRAMINE HCL 50 MG/ML IJ SOLN
25.0000 mg | Freq: Once | INTRAMUSCULAR | Status: AC
  Administered 2015-05-23: 25 mg via INTRAVENOUS
  Filled 2015-05-23: qty 1

## 2015-05-23 MED ORDER — PEGFILGRASTIM 6 MG/0.6ML ~~LOC~~ PSKT
6.0000 mg | PREFILLED_SYRINGE | Freq: Once | SUBCUTANEOUS | Status: AC
  Administered 2015-05-23: 6 mg via SUBCUTANEOUS
  Filled 2015-05-23: qty 0.6

## 2015-05-29 ENCOUNTER — Telehealth: Payer: Self-pay | Admitting: Pharmacist

## 2015-05-29 NOTE — Telephone Encounter (Signed)
Had a call from nursing home regarding on body neulasta. Patients on body injector fell off today. Injector was put on the patient on 05/23/15 and should have been taken off on the 05/24/15. Spoke with Sherilyn Banker  [phone])

## 2015-06-01 NOTE — Progress Notes (Signed)
[place]  Telephone:(336) [phone] Fax:(336) [phone]  ID: ENNIO HOUP OB: [birth year]  MR#: [id number]  NLZ#:[id number]  Patient Care Team: Marden Noble, MD as PCP - General (Internal Medicine)  CHIEF COMPLAINT:  Chief Complaint  Patient presents with  . Lung Cancer    follow up    INTERVAL HISTORY: Patient is here for further evaluation, discussion of his imaging, and consideration of cycle 7 of carboplatinum and Taxol.  The entire history is given by his brother secondary to patient's expressive aphasia. He currently feels well and is back to his baseline.  He has no recent fevers. He has no further mouth pain.  He has no chest pain. He denies any shortness of breath, cough, or hemoptysis. He has a fair appetite, but denies any nausea, vomiting, constipation, or diarrhea. He has no urinary complaints. Patient offers no specific complaints today.   REVIEW OF SYSTEMS:   Review of Systems  Constitutional: Positive for malaise/fatigue.  HENT: Negative.   Respiratory: Negative.   Cardiovascular: Negative.   Neurological: Positive for weakness.    As per HPI. Otherwise, a complete review of systems is negatve.  PAST MEDICAL HISTORY: Past Medical History  Diagnosis Date  . Lung cancer   . Stroke   . Seizures   . Expressive aphasia     PAST SURGICAL HISTORY: Past Surgical History  Procedure Laterality Date  . Partial colectomy      FAMILY HISTORY Family History  Problem Relation Age of Onset  . Diabetes Mother        ADVANCED DIRECTIVES:    HEALTH MAINTENANCE: Social History  Substance Use Topics  . Smoking status: Former Research scientist (life sciences)  . Smokeless tobacco: Never Used  . Alcohol Use: No     Colonoscopy:  PAP:  Bone density:  Lipid panel:  Allergies  Allergen Reactions  . No Known Allergies     Current Outpatient Prescriptions  Medication Sig Dispense Refill  . acetaminophen (TYLENOL) 325 MG tablet Take 650 mg by mouth every 4  (four) hours as needed.    . Cholecalciferol (VITAMIN D3) 50000 UNITS CAPS Take 1 capsule by mouth once a week.     . clopidogrel (PLAVIX) 75 MG tablet Take 75 mg by mouth daily.    . ferrous sulfate 325 (65 FE) MG tablet Take 1 tablet (325 mg total) by mouth 2 (two) times daily with a meal. 60 tablet 0  . folic acid (FOLVITE) 1 MG tablet Take 1 mg by mouth daily.    Marland Kitchen lactulose, encephalopathy, (CHRONULAC) 10 GM/15ML SOLN Take 10 g by mouth daily.    Marland Kitchen levETIRAcetam (KEPPRA) 500 MG tablet Take 500 mg by mouth 2 (two) times daily.    . megestrol (MEGACE) 40 MG/ML suspension Take 400 mg by mouth 2 (two) times daily.    . mirtazapine (REMERON) 7.5 MG tablet     . morphine (ROXANOL) 20 MG/ML concentrated solution Take by mouth every 2 (two) hours as needed for severe pain. Give 0.45m sublinguially for SOB or pain    . nystatin (MYCOSTATIN) 100000 UNIT/ML suspension     . omeprazole (PRILOSEC) 20 MG capsule     . phenytoin (DILANTIN) 100 MG ER capsule Take 300 mg by mouth daily.     . potassium chloride SA (K-DUR,KLOR-CON) 20 MEQ tablet Take 40 mEq by mouth 2 (two) times daily.    . prochlorperazine (COMPAZINE) 10 MG tablet Take 10 mg by mouth every 6 (six) hours as needed for  nausea or vomiting.    . senna-docusate (SENOKOT-S) 8.6-50 MG per tablet Take 1 tablet by mouth 2 (two) times daily.    . Skin Protectants, Misc. (EUCERIN) cream Apply 1 application topically 2 (two) times daily as needed for dry skin.     Marland Kitchen amoxicillin (AMOXIL) 500 MG capsule      No current facility-administered medications for this visit.   Facility-Administered Medications Ordered in Other Visits  Medication Dose Route Frequency Provider Last Rate Last Dose  . sodium chloride 0.9 % injection 10 mL  10 mL Intravenous PRN Lloyd Huger, MD   10 mL at 04/11/15 0912    OBJECTIVE: Filed Vitals:   05/23/15 0940  BP: 102/70  Pulse: 93  Temp: 97.7 F (36.5 C)  Resp: 20     There is no weight on file to calculate  BMI.    ECOG FS:1 - Symptomatic but completely ambulatory  General: Well-developed, well-nourished, no acute distress. Eyes: anicteric sclera. Lungs: Clear to auscultation bilaterally. Heart: Regular rate and rhythm. No rubs, murmurs, or gallops. Abdomen: Soft, nontender, nondistended. No organomegaly noted, normoactive bowel sounds. Musculoskeletal: No edema, cyanosis, or clubbing. Neuro: Alert, answering all questions appropriately. Cranial nerves grossly intact. Skin: No rashes or petechiae noted. Psych: Normal affect.    LAB RESULTS:  Lab Results  Component Value Date   NA 137 05/23/2015   K 3.5 05/23/2015   CL 110 05/23/2015   CO2 22 05/23/2015   GLUCOSE 123* 05/23/2015   BUN 12 05/23/2015   CREATININE 0.89 05/23/2015   CALCIUM 8.9 05/23/2015   PROT 8.1 05/23/2015   ALBUMIN 3.9 05/23/2015   AST 25 05/23/2015   ALT 10* 05/23/2015   ALKPHOS 90 05/23/2015   BILITOT 0.5 05/23/2015   GFRNONAA >60 05/23/2015   GFRAA >60 05/23/2015    Lab Results  Component Value Date   WBC 8.9 05/23/2015   NEUTROABS 4.8 05/23/2015   HGB 9.3* 05/23/2015   HCT 27.7* 05/23/2015   MCV 104.7* 05/23/2015   PLT 208 05/23/2015     STUDIES: Nm Pet Image Restag (ps) Skull Base To Thigh  05/09/2015   CLINICAL DATA:  Subsequent treatment strategy for stage IV lung cancer.  EXAM: NUCLEAR MEDICINE PET SKULL BASE TO THIGH  TECHNIQUE: 12.45 MCi F-18 FDG was injected intravenously. Full-ring PET imaging was performed from the skull base to thigh after the radiotracer. CT data was obtained and used for attenuation correction and anatomic localization.  FASTING BLOOD GLUCOSE:  Value: 77 mg/dl  COMPARISON:  Multiple priors, most recently PET-CT 02/25/2015.  FINDINGS: NECK  No hypermetabolic lymph nodes in the neck.  CHEST  Compared to the prior study from 02/25/2015, there is slight increasing nodularity in the inferior segment of the lingula adjacent to the major fissure, with the largest independent  nodule measuring approximately 1.4 cm (113 of series 3) demonstrating hypermetabolism (SUVmax = 7.2). There is also amorphous density in the more central aspect of the inferior segment of the lingula which is also hypermetabolic (SUVmax = 01.7). Findings are concerning for local recurrence of disease. Previously noted new nodules in the superior segment of the left lower lobe are slightly smaller than the prior study, and less nodular in appearance, favored to reflect resolving infectious or inflammatory nodules. Notably, this region is no longer hypermetabolic. No new suspicious appearing pulmonary nodules or masses are otherwise noted. Heart size is normal. There is no significant pericardial fluid, thickening or pericardial calcification. There is atherosclerosis of the thoracic aorta,  the great vessels of the mediastinum and the coronary arteries, including calcified atherosclerotic plaque in the left anterior descending and left circumflex coronary arteries. Right internal jugular single lumen porta cath with tip terminating at the superior cavoatrial junction. Well-circumscribed 2.7 x 2.1 cm partially calcified right lobe of thyroid nodule demonstrates no internal hypermetabolism and is similar to prior studies (nonspecific).  ABDOMEN/PELVIS  Today's study demonstrates recurrent hypermetabolism in the region of the head of the pancreas (SUVmax = 5.5), without a clearly identifiable mass on the noncontrast CT images. No hypermetabolism within the liver, gallbladder or bilateral adrenal glands. Status post splenectomy. The appearance of the kidneys bilaterally is unremarkable. No significant volume of ascites. No pneumoperitoneum. No pathologic dilatation of small bowel or colon. Numerous surgical clips in the central abdomen. Atherosclerosis throughout the abdominal and pelvic vasculature, without definite aneurysm.  SKELETON  No focal hypermetabolic activity to suggest skeletal metastasis.  IMPRESSION: 1.  Today's study demonstrates slight progression of disease, increasing nodularity and hypermetabolism in the inferior segment of the lingula, as well as recurrent hypermetabolism at the site of the biopsy-proven metastatic lesion in the pancreatic head. 2. Previously noted nodules in the superior segment of the left lower lobe appear to be resolving compared to the prior study, and are favored to be infectious or inflammatory in etiology. 3. Previously noted left hilar hypermetabolism is no longer identified. 4. Additional incidental findings, as above, similar to prior studies.   Electronically Signed   By: Vinnie Langton M.D.   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  PLAN:    1. Lung cancer: PET results from May 09, 2015 reviewed independently and reported as above with mild progression of disease. This is possibly secondary to his extended chemotherapy break from his abscessed tooth. After lengthy discussion with the patient and his brother they have agreed to do a 2 additional treatments with carboplatinum and Taxol and then repeat imaging. Proceed with cycle 7 today. Return to clinic in 3 weeks for consideration of cycle 8. Will reimage with PET scan at the conclusion of cycle 8. 2. Pancreatic mass: PET and biopsy confirm metastatic lesion. Chemotherapy and PET as above. 3. Hypokalemia: Potassium is now within normal limits. Continue oral supplementation as prescribed. 4. Reduced appetite: Continue Megace as prescribed. 5. Abscessed tooth: Resolved.  Patient expressed understanding and was in agreement with this plan. He also understands that He can call clinic at any time with any questions, concerns, or complaints.    Lloyd Huger, MD   06/01/2015 6:52 PM

## 2015-06-11 ENCOUNTER — Other Ambulatory Visit: Payer: Self-pay | Admitting: *Deleted

## 2015-06-11 ENCOUNTER — Telehealth: Payer: Self-pay | Admitting: *Deleted

## 2015-06-11 DIAGNOSIS — J189 Pneumonia, unspecified organism: Secondary | ICD-10-CM

## 2015-06-11 NOTE — Telephone Encounter (Signed)
Ok, thanks.

## 2015-06-11 NOTE — Telephone Encounter (Signed)
I received call back from [facility] NP who has been providing care for patient at [facility]. She states upon evaluation today patient is rapidly declining, he has lost a significant amount of weight, no po intake, patient is unable to get out of bed and sit in wheelchair and began seizing last night. Referral to Hospice was initiated by palliative care at the request of family. I entered order to cancel future appointments at the cancer center per patient/family preference to not continue with treatment.

## 2015-06-13 ENCOUNTER — Inpatient Hospital Stay: Payer: Medicare Other | Admitting: Oncology

## 2015-06-13 ENCOUNTER — Inpatient Hospital Stay: Payer: Medicare Other

## 2015-07-09 DEATH — deceased

## 2015-09-05 ENCOUNTER — Other Ambulatory Visit: Payer: Self-pay | Admitting: Nurse Practitioner

## 2016-03-03 IMAGING — CR DG CHEST 1V PORT
1 series · 1 of 1 positions shown · non-contrast
Comparison: CT chest dated 08/08/2014

CLINICAL DATA: Fever, abdominal pain, difficulty breathing. History
of pancreatic cancer

EXAM:
PORTABLE CHEST - 1 VIEW

[ap]
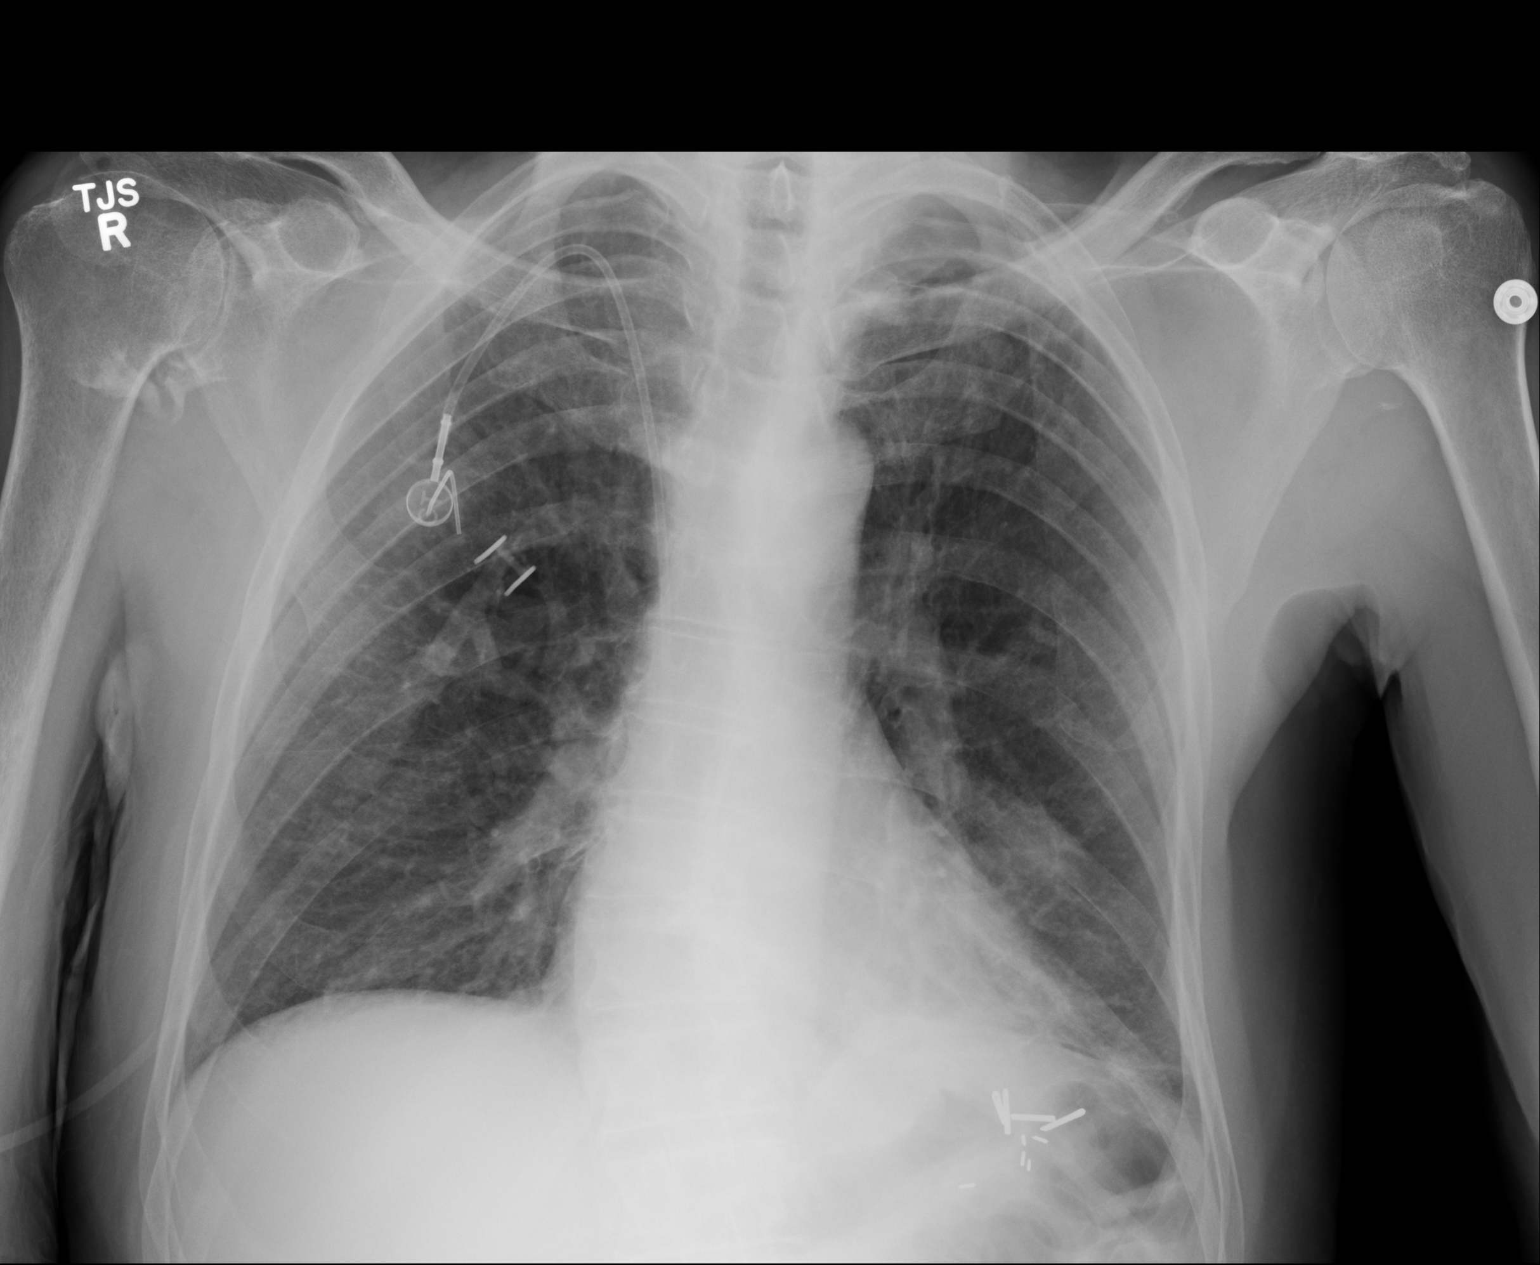

[1 of 1 positions shown; findings below may reference images not displayed]

FINDINGS: Mild patchy opacity in the left lower lobe, atelectasis versus
pneumonia. Underlying chronic interstitial markings. No pleural
effusion or pneumothorax.

The heart is normal in size.

Right chest power port terminating at the cavoatrial junction.
IMPRESSION: Mild patchy opacity in the left lower lobe, atelectasis versus
pneumonia.

## 2016-06-17 IMAGING — CT NM PET TUM IMG RESTAG (PS) SKULL BASE T - THIGH
1 of 9 series · 1 of 25 positions shown · non-contrast
Comparison: Multiple priors, most recently PET-CT 02/25/2015.

CLINICAL DATA: Subsequent treatment strategy for stage IV lung
cancer.

EXAM:
NUCLEAR MEDICINE PET SKULL BASE TO THIGH
TECHNIQUE: 12.45 MCi F-18 FDG was injected intravenously. Full-ring PET imaging
was performed from the skull base to thigh after the radiotracer. CT
data was obtained and used for attenuation correction and anatomic
localization.
FASTING BLOOD GLUCOSE:  Value: 77 mg/dl

[Series 3: ct wb 5.0 b30f · axial · 5.0mm · 0.98mm/px · 1 of 290 slices shown]
[im 290/290  brain]
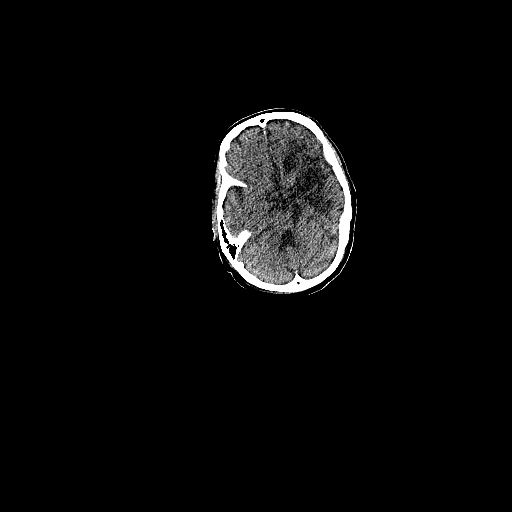

[1 of 25 positions shown; findings below may reference images not displayed]

FINDINGS: NECK

No hypermetabolic lymph nodes in the neck.

CHEST

Compared to the prior study from 02/25/2015, there is slight
increasing nodularity in the inferior segment of the lingula
adjacent to the major fissure, with the largest independent nodule
measuring approximately 1.4 cm (113 of series 3) demonstrating
hypermetabolism (SUVmax = 7.2). There is also amorphous density in
the more central aspect of the inferior segment of the lingula which
is also hypermetabolic (SUVmax = 10.2). Findings are concerning for
local recurrence of disease. Previously noted new nodules in the
superior segment of the left lower lobe are slightly smaller than
the prior study, and less nodular in appearance, favored to reflect
resolving infectious or inflammatory nodules. Notably, this region
is no longer hypermetabolic. No new suspicious appearing pulmonary
nodules or masses are otherwise noted. Heart size is normal. There
is no significant pericardial fluid, thickening or pericardial
calcification. There is atherosclerosis of the thoracic aorta, the
great vessels of the mediastinum and the coronary arteries,
including calcified atherosclerotic plaque in the left anterior
descending and left circumflex coronary arteries. Right internal
jugular single lumen porta cath with tip terminating at the superior
cavoatrial junction. Well-circumscribed 2.7 x 2.1 cm partially
calcified right lobe of thyroid nodule demonstrates no internal
hypermetabolism and is similar to prior studies (nonspecific).

ABDOMEN/PELVIS

Today's study demonstrates recurrent hypermetabolism in the region
of the head of the pancreas (SUVmax = 5.5), without a clearly
identifiable mass on the noncontrast CT images. No hypermetabolism
within the liver, gallbladder or bilateral adrenal glands. Status
post splenectomy. The appearance of the kidneys bilaterally is
unremarkable. No significant volume of ascites. No pneumoperitoneum.
No pathologic dilatation of small bowel or colon. Numerous surgical
clips in the central abdomen. Atherosclerosis throughout the
abdominal and pelvic vasculature, without definite aneurysm.

SKELETON

No focal hypermetabolic activity to suggest skeletal metastasis.
IMPRESSION: 1. Today's study demonstrates slight progression of disease,
increasing nodularity and hypermetabolism in the inferior segment of
the lingula, as well as recurrent hypermetabolism at the site of the
biopsy-proven metastatic lesion in the pancreatic head.
2. Previously noted nodules in the superior segment of the left
lower lobe appear to be resolving compared to the prior study, and
are favored to be infectious or inflammatory in etiology.
3. Previously noted left hilar hypermetabolism is no longer
identified.
4. Additional incidental findings, as above, similar to prior
studies.
